# Patient Record
Sex: Female | Born: 1955 | Race: White | Hispanic: No | Marital: Married | Smoking: Never smoker
Health system: Southern US, Community
[De-identification: ages and names within clinical notes are randomized; demographics above are authoritative.]

## PROBLEM LIST (undated history)

## (undated) DIAGNOSIS — Z806 Family history of leukemia: Secondary | ICD-10-CM

## (undated) DIAGNOSIS — K635 Polyp of colon: Secondary | ICD-10-CM

## (undated) DIAGNOSIS — R112 Nausea with vomiting, unspecified: Secondary | ICD-10-CM

## (undated) DIAGNOSIS — K760 Fatty (change of) liver, not elsewhere classified: Secondary | ICD-10-CM

## (undated) DIAGNOSIS — K219 Gastro-esophageal reflux disease without esophagitis: Secondary | ICD-10-CM

## (undated) DIAGNOSIS — Z800 Family history of malignant neoplasm of digestive organs: Secondary | ICD-10-CM

## (undated) DIAGNOSIS — T7840XA Allergy, unspecified, initial encounter: Secondary | ICD-10-CM

## (undated) DIAGNOSIS — C801 Malignant (primary) neoplasm, unspecified: Secondary | ICD-10-CM

## (undated) DIAGNOSIS — Z8489 Family history of other specified conditions: Secondary | ICD-10-CM

## (undated) DIAGNOSIS — R011 Cardiac murmur, unspecified: Secondary | ICD-10-CM

## (undated) DIAGNOSIS — K602 Anal fissure, unspecified: Secondary | ICD-10-CM

## (undated) DIAGNOSIS — Z803 Family history of malignant neoplasm of breast: Secondary | ICD-10-CM

## (undated) DIAGNOSIS — Z8719 Personal history of other diseases of the digestive system: Secondary | ICD-10-CM

## (undated) DIAGNOSIS — R7303 Prediabetes: Secondary | ICD-10-CM

## (undated) DIAGNOSIS — T8859XA Other complications of anesthesia, initial encounter: Secondary | ICD-10-CM

## (undated) DIAGNOSIS — M8580 Other specified disorders of bone density and structure, unspecified site: Secondary | ICD-10-CM

## (undated) DIAGNOSIS — G4730 Sleep apnea, unspecified: Secondary | ICD-10-CM

## (undated) DIAGNOSIS — Z9889 Other specified postprocedural states: Secondary | ICD-10-CM

## (undated) DIAGNOSIS — Z8349 Family history of other endocrine, nutritional and metabolic diseases: Secondary | ICD-10-CM

## (undated) DIAGNOSIS — Z832 Family history of diseases of the blood and blood-forming organs and certain disorders involving the immune mechanism: Secondary | ICD-10-CM

## (undated) DIAGNOSIS — I10 Essential (primary) hypertension: Secondary | ICD-10-CM

## (undated) DIAGNOSIS — M1990 Unspecified osteoarthritis, unspecified site: Secondary | ICD-10-CM

## (undated) DIAGNOSIS — E669 Obesity, unspecified: Secondary | ICD-10-CM

## (undated) DIAGNOSIS — T4145XA Adverse effect of unspecified anesthetic, initial encounter: Secondary | ICD-10-CM

## (undated) HISTORY — DX: Essential (primary) hypertension: I10

## (undated) HISTORY — DX: Unspecified osteoarthritis, unspecified site: M19.90

## (undated) HISTORY — DX: Polyp of colon: K63.5

## (undated) HISTORY — DX: Family history of leukemia: Z80.6

## (undated) HISTORY — DX: Family history of malignant neoplasm of breast: Z80.3

## (undated) HISTORY — PX: TONSILECTOMY, ADENOIDECTOMY, BILATERAL MYRINGOTOMY AND TUBES: SHX2538

## (undated) HISTORY — DX: Gastro-esophageal reflux disease without esophagitis: K21.9

## (undated) HISTORY — DX: Family history of other endocrine, nutritional and metabolic diseases: Z83.49

## (undated) HISTORY — PX: WISDOM TOOTH EXTRACTION: SHX21

## (undated) HISTORY — DX: Other specified disorders of bone density and structure, unspecified site: M85.80

## (undated) HISTORY — DX: Family history of diseases of the blood and blood-forming organs and certain disorders involving the immune mechanism: Z83.2

## (undated) HISTORY — PX: POLYPECTOMY: SHX149

## (undated) HISTORY — DX: Family history of malignant neoplasm of digestive organs: Z80.0

## (undated) HISTORY — DX: Obesity, unspecified: E66.9

## (undated) HISTORY — DX: Sleep apnea, unspecified: G47.30

## (undated) HISTORY — DX: Allergy, unspecified, initial encounter: T78.40XA

## (undated) HISTORY — PX: WRIST SURGERY: SHX841

## (undated) HISTORY — DX: Malignant (primary) neoplasm, unspecified: C80.1

## (undated) HISTORY — DX: Anal fissure, unspecified: K60.2

---
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
## 2013-07-27 HISTORY — PX: COLONOSCOPY: SHX174

## 2015-10-14 DIAGNOSIS — H35369 Drusen (degenerative) of macula, unspecified eye: Secondary | ICD-10-CM | POA: Insufficient documentation

## 2016-03-02 DIAGNOSIS — G4733 Obstructive sleep apnea (adult) (pediatric): Secondary | ICD-10-CM | POA: Insufficient documentation

## 2018-06-06 ENCOUNTER — Other Ambulatory Visit: Payer: Self-pay | Admitting: Internal Medicine

## 2018-06-06 DIAGNOSIS — K760 Fatty (change of) liver, not elsewhere classified: Secondary | ICD-10-CM

## 2018-06-06 DIAGNOSIS — R109 Unspecified abdominal pain: Secondary | ICD-10-CM

## 2018-06-10 ENCOUNTER — Encounter (INDEPENDENT_AMBULATORY_CARE_PROVIDER_SITE_OTHER): Payer: Self-pay

## 2018-06-10 ENCOUNTER — Ambulatory Visit
Admission: RE | Admit: 2018-06-10 | Discharge: 2018-06-10 | Disposition: A | Discharge status: Home / Self Care | Payer: Self-pay | Source: Ambulatory Visit | Attending: Internal Medicine | Admitting: Internal Medicine

## 2018-06-10 ENCOUNTER — Encounter: Payer: Self-pay | Admitting: Internal Medicine

## 2018-06-10 ENCOUNTER — Ambulatory Visit: Payer: BLUE CROSS/BLUE SHIELD | Admitting: Internal Medicine

## 2018-06-10 VITALS — BP 118/84 | HR 68 | Ht 65.0 in | Wt 199.0 lb

## 2018-06-10 DIAGNOSIS — Z86010 Personal history of colonic polyps: Secondary | ICD-10-CM | POA: Diagnosis not present

## 2018-06-10 DIAGNOSIS — R1310 Dysphagia, unspecified: Secondary | ICD-10-CM | POA: Diagnosis not present

## 2018-06-10 DIAGNOSIS — K219 Gastro-esophageal reflux disease without esophagitis: Secondary | ICD-10-CM | POA: Diagnosis not present

## 2018-06-10 DIAGNOSIS — R1012 Left upper quadrant pain: Secondary | ICD-10-CM | POA: Diagnosis not present

## 2018-06-10 DIAGNOSIS — K760 Fatty (change of) liver, not elsewhere classified: Secondary | ICD-10-CM

## 2018-06-10 DIAGNOSIS — R109 Unspecified abdominal pain: Secondary | ICD-10-CM

## 2018-06-10 MED ORDER — PANTOPRAZOLE SODIUM 40 MG PO TBEC: 40.0000 mg | DELAYED_RELEASE_TABLET | Freq: Every day | ORAL | 11 refills | Status: DC

## 2018-06-10 NOTE — Patient Instructions (Signed)
We have sent the following medications to your pharmacy for you to pick up at your convenience:  Protonix  Call back when you are ready to schedule your colonoscopy

## 2018-06-10 NOTE — Progress Notes (Signed)
HISTORY OF PRESENT ILLNESS:  Laura Fisher is a 62 y.o. female who is new to this practice is sent today by her primary care provider Dr. Shearon Stalls to establish GI care with a chief complaint of left and right upper quadrant burning discomfort.  The patient has had prior GI care in [place].  She tells me that she is undergone several colonoscopies and has a history of polyps.  She tells me that she has colonoscopies approximately every 5 years and is due for her next examination around June 2020.  There are no outside records for review but these have been requested.  Patient's current history is that of burning discomfort in the left and right upper quadrant for approximately 4 to 6 weeks.  No additional associated symptoms such as nausea or vomiting.  No weight loss.  No bleeding.  No obvious exacerbating or relieving factors other seems to be a musculoskeletal component.  She does have a history of GERD and was doing well on Protonix.  She discontinued this with concerns over possible long-term side effects that she had heard about through the lay press.  She does have intermittent solid food dysphagia to chicken.  She has not had prior EGD.  She would like to resume PPI if safe.  She saw her PCP regarding her upper abdominal burning discomfort and underwent laboratories which were unremarkable.  She also had an abdominal ultrasound in [place] today.  I have reviewed this report as well.  The examination was normal except for fatty liver.  Normal gallbladder.  REVIEW OF SYSTEMS:  All non-GI ROS negative unless otherwise stated in the HPI except for cough, itching, sleeping problems  Past Medical History:  Diagnosis Date  . Anal fissure   . Colon polyp   . GERD (gastroesophageal reflux disease)   . High blood pressure   . Obesity   . Sleep apnea     Past Surgical History:  Procedure Laterality Date  . COLONOSCOPY  2015  . TONSILECTOMY, ADENOIDECTOMY, BILATERAL MYRINGOTOMY AND TUBES     . WRIST SURGERY Bilateral     Social History Janasia Netherland  reports that she has never smoked. She has never used smokeless tobacco. She reports that she does not drink alcohol or use drugs.  family history includes Diabetes in her sister; Heart disease in her father and mother; Pancreatic cancer in her other; Tongue cancer (age of onset: 65) in her mother.  Allergies  Allergen Reactions  . Ciprofloxacin   . Erythromycin   . Macrobid [Nitrofurantoin Macrocrystal]   . Septra [Sulfamethoxazole-Trimethoprim]        PHYSICAL EXAMINATION: Vital signs: BP 118/84   Pulse 68   Ht 5\' 5"  (1.651 m)   Wt 199 lb (90.3 kg)   BMI 33.12 kg/m   Constitutional: generally well-appearing, no acute distress Psychiatric: alert and oriented x3, cooperative Eyes: extraocular movements intact, anicteric, conjunctiva pink Mouth: oral pharynx moist, no lesions Neck: supple no lymphadenopathy Cardiovascular: heart regular rate and rhythm, no murmur Lungs: clear to auscultation bilaterally Abdomen: soft, nontender, nondistended, no obvious ascites, no peritoneal signs, normal bowel sounds, no organomegaly Rectal: Deferred until colonoscopy Extremities: no clubbing, cyanosis, or lower extremity edema bilaterally Skin: no lesions on visible extremities Neuro: No focal deficits.  Cranial nerves intact  ASSESSMENT:  1.  Vague upper abdominal burning discomfort.  No alarm features.  Likely musculoskeletal 2.  GERD.  Untreated.  Symptomatic 3.  Intermittent solid food dysphasia chicken.  Rule out peptic stricture 4.  History of colon polyps due for surveillance in the very near future. 4.  Obesity.  BMI 33.  Fatty liver on ultrasound   PLAN:  1.  Reflux precautions 2.  Weight loss 3.  Prescribe pantoprazole 40 mg daily.  Multiple refills 4.  Discussed current knowledge base regarding chronic PPI use and potential long-term side effects 5.  Schedule upper endoscopy to evaluate chronic GERD,  dysphasia, and upper abdominal discomfort.The nature of the procedure, as well as the risks, benefits, and alternatives were carefully and thoroughly reviewed with the patient. Ample time for discussion and questions allowed. The patient understood, was satisfied, and agreed to proceed. 6.  Schedule surveillance colonoscopy..The nature of the procedure, as well as the risks, benefits, and alternatives were carefully and thoroughly reviewed with the patient. Ample time for discussion and questions allowed. The patient understood, was satisfied, and agreed to proceed. 7.  Obtain outside records for review  A copy of this consultation note has been sent to Dr. Shearon Stalls

## 2018-08-04 ENCOUNTER — Ambulatory Visit (INDEPENDENT_AMBULATORY_CARE_PROVIDER_SITE_OTHER): Payer: BLUE CROSS/BLUE SHIELD | Admitting: Family Medicine

## 2018-08-04 ENCOUNTER — Ambulatory Visit (INDEPENDENT_AMBULATORY_CARE_PROVIDER_SITE_OTHER): Payer: Self-pay

## 2018-08-04 ENCOUNTER — Encounter (INDEPENDENT_AMBULATORY_CARE_PROVIDER_SITE_OTHER): Payer: Self-pay | Admitting: Family Medicine

## 2018-08-04 DIAGNOSIS — M25561 Pain in right knee: Secondary | ICD-10-CM

## 2018-08-04 MED ORDER — ETODOLAC 400 MG PO TABS: 400.0000 mg | ORAL_TABLET | Freq: Two times a day (BID) | ORAL | 3 refills | Status: DC | PRN

## 2018-08-04 NOTE — Progress Notes (Signed)
Office Visit Note   Patient: Laura Fisher           Date of Birth: September 16, 1955           MRN: [id number] Visit Date: 08/04/2018 Requested by: Kennieth Rad, MD [facility] [address] [place] [address] PCP: Kennieth Rad, MD  Subjective: Chief Complaint  Patient presents with  . Right Knee - Pain    Pain started with going up steps 08/01/18 & felt a pop in the knee with walking the next day. Pain is medial aspect.    HPI: She is a 63 year old with right knee pain.  On January 6 she was walking up steps at church and felt a sudden pop in her knee with severe pain.  She had to stop for a minute, then she was able to walk without any discomfort.  The next day she was fine throughout the morning, but then in the afternoon she had another episode of sudden severe pain, unable since then her knee has been continuously uncomfortable, pain on the medial aspect with some swelling and a severe limp.  She never really had problems with her knee prior to this.  She has had troubles with her weight and would like to get back to a walking regimen to help lose weight, but cannot right now because of his pain.              ROS: She has hypertension which has been well controlled.  Other systems are reviewed and are negative.  Objective: Vital Signs: There were no vitals taken for this visit.  Physical Exam:  Right knee: 1+ effusion, no warmth or erythema.  Exquisite tenderness over the anteromedial and posterior lateral joint lines.  Pain but no palpable click with McMurray's.  She is able to fully extend her knee, flexion is limited to about 110.  Imaging: X-rays right knee: Mild to moderate tricompartmental arthritic change with a possible loose body seen on the AP view.  I do not see it on the lateral view, however.  Assessment & Plan: 1.  Right knee pain with possible loose body versus meniscus tear -Discussed options with patient, she would like to proceed with MRI scan  to look for loose body or meniscus tear and surgical consult depending on the findings.  Lodine for inflammation.   Follow-Up Instructions: No follow-ups on file.      Procedures: No procedures performed  No notes on file    PMFS History: Patient Active Problem List   Diagnosis Date Noted  . Moderate obstructive sleep apnea 03/02/2016  . Drusen 10/14/2015   Past Medical History:  Diagnosis Date  . Anal fissure   . Colon polyp   . GERD (gastroesophageal reflux disease)   . High blood pressure   . Obesity   . Sleep apnea     Family History  Problem Relation Age of Onset  . Tongue cancer Mother 73  . Heart disease Mother   . Heart disease Father   . Diabetes Sister   . Pancreatic cancer Other        Nephew    Past Surgical History:  Procedure Laterality Date  . COLONOSCOPY  2015  . TONSILECTOMY, ADENOIDECTOMY, BILATERAL MYRINGOTOMY AND TUBES    . WRIST SURGERY Bilateral    Social History   Occupational History  . Occupation: Art therapist  Tobacco Use  . Smoking status: Never Smoker  . Smokeless tobacco: Never Used  Substance and Sexual Activity  .  Alcohol use: Never    Frequency: Never  . Drug use: Never  . Sexual activity: Yes    Partners: Male

## 2018-08-06 ENCOUNTER — Ambulatory Visit
Admission: RE | Admit: 2018-08-06 | Discharge: 2018-08-06 | Disposition: A | Discharge status: Home / Self Care | Payer: BLUE CROSS/BLUE SHIELD | Source: Ambulatory Visit | Attending: Family Medicine | Admitting: Family Medicine

## 2018-08-06 DIAGNOSIS — M25561 Pain in right knee: Secondary | ICD-10-CM

## 2018-08-08 ENCOUNTER — Telehealth (INDEPENDENT_AMBULATORY_CARE_PROVIDER_SITE_OTHER): Payer: Self-pay | Admitting: Family Medicine

## 2018-08-08 DIAGNOSIS — M25561 Pain in right knee: Secondary | ICD-10-CM

## 2018-08-08 NOTE — Telephone Encounter (Signed)
I called and advised the patient of all instructions. She will continue to use ice or heat if needed (whichever gives her more relief).  She is choosing to wait a little longer before trying a cortisone injection.  She then asked if PT would be a good idea for her, and if it is, when should she start this? I asked her to check into the PT facilities in [place], [place] and let us know where she would like to go - she'll call back with this info.

## 2018-08-08 NOTE — Telephone Encounter (Signed)
Patient called advised she had her MRI on Saturday and asked if the results have come back yet. The number to contact patient is [phone]

## 2018-08-08 NOTE — Telephone Encounter (Signed)
MRI shows a substantial amount of arthritis in the joint, no sign of loose body or meniscus tear that would benefit from arthroscopic surgery.  If pain is not improved, could consider a cortisone injection.

## 2018-08-08 NOTE — Telephone Encounter (Signed)
See other note

## 2018-08-08 NOTE — Telephone Encounter (Signed)
We do have the results in the chart - please advise.

## 2018-08-08 NOTE — Telephone Encounter (Signed)
Ok to bear weight as tolerated.  For arthritis: - glucosamine sulfate 1,000 mg twice daily - turmeric capsules 500 mg twice daily - avoid sugar - try to lose weight to a normal weight for height - walk for exercise once pain improves

## 2018-08-08 NOTE — Telephone Encounter (Signed)
I called and advised the patient of her results. She has been icing the knee and it is helping with the pain, but she has been afraid to put any weight on the leg. Is it ok to start bearing weight, and if so, how much? Is there anything else she should be doing for the arthritis in the knee?

## 2018-08-09 NOTE — Addendum Note (Signed)
Addended by: Hortencia Pilar on: 08/09/2018 07:53 AM   Modules accepted: Orders

## 2018-08-09 NOTE — Telephone Encounter (Signed)
Generic external PT order placed.

## 2018-08-10 ENCOUNTER — Ambulatory Visit (INDEPENDENT_AMBULATORY_CARE_PROVIDER_SITE_OTHER): Payer: BLUE CROSS/BLUE SHIELD | Admitting: Family Medicine

## 2018-08-10 ENCOUNTER — Encounter (INDEPENDENT_AMBULATORY_CARE_PROVIDER_SITE_OTHER): Payer: Self-pay | Admitting: Family Medicine

## 2018-08-10 DIAGNOSIS — E559 Vitamin D deficiency, unspecified: Secondary | ICD-10-CM

## 2018-08-10 DIAGNOSIS — M25562 Pain in left knee: Secondary | ICD-10-CM | POA: Diagnosis not present

## 2018-08-10 DIAGNOSIS — M25561 Pain in right knee: Secondary | ICD-10-CM

## 2018-08-10 NOTE — Progress Notes (Signed)
   Office Visit Note   Patient: Laura Fisher           Date of Birth: 1955/11/18           MRN: [id number] Visit Date: 08/10/2018 Requested by: Kennieth Rad, MD [facility] [address] [place] [address] PCP: Kennieth Rad, MD  Subjective: Chief Complaint  Patient presents with  . Right Knee - Pain, Follow-up    Can bear weight on knee now.    HPI: She is here for follow-up right greater than left knee pain.  Pain improving, still hurting quite a bit but not as much as before.  MRI showed arthritis, no sign of meniscus tear or loose body.               ROS: Noncontributory  Objective: Vital Signs: There were no vitals taken for this visit.  Physical Exam:  Right knee: Trace effusion, no warmth or erythema.  Very tender on the medial joint line.  No palpable click with McMurray's.  Left knee is also slightly tender on the medial joint line.  Imaging: None today.  Assessment & Plan: 1.  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  Over-the-counter vitamins to support bone health.  Check vitamin D level today.  She is starting to change diet to work on weight loss.   Follow-Up Instructions: No follow-ups on file.      Procedures: No procedures performed  No notes on file    PMFS History: Patient Active Problem List   Diagnosis Date Noted  . Moderate obstructive sleep apnea 03/02/2016  . Drusen 10/14/2015   Past Medical History:  Diagnosis Date  . Anal fissure   . Colon polyp   . GERD (gastroesophageal reflux disease)   . High blood pressure   . Obesity   . Sleep apnea     Family History  Problem Relation Age of Onset  . Tongue cancer Mother 64  . Heart disease Mother   . Heart disease Father   . Diabetes Sister   . Pancreatic cancer Other        Nephew    Past Surgical History:  Procedure Laterality Date  . COLONOSCOPY  2015  . TONSILECTOMY, ADENOIDECTOMY,  BILATERAL MYRINGOTOMY AND TUBES    . WRIST SURGERY Bilateral    Social History   Occupational History  . Occupation: Art therapist  Tobacco Use  . Smoking status: Never Smoker  . Smokeless tobacco: Never Used  Substance and Sexual Activity  . Alcohol use: Never    Frequency: Never  . Drug use: Never  . Sexual activity: Yes    Partners: Male

## 2018-08-10 NOTE — Telephone Encounter (Signed)
The order was given to the patient at today's office visit.

## 2018-08-10 NOTE — Patient Instructions (Signed)
   Vitamin D3:  5,000 IU daily  Magnesium:  200-400 mg daily  Vitamin K2:  100 mcg daily  Calcium from dietary sources.

## 2018-08-11 ENCOUNTER — Telehealth (INDEPENDENT_AMBULATORY_CARE_PROVIDER_SITE_OTHER): Payer: Self-pay | Admitting: Family Medicine

## 2018-08-11 LAB — VITAMIN D 25 HYDROXY (VIT D DEFICIENCY, FRACTURES): Vit D, 25-Hydroxy: 35 ng/mL (ref 30–100)

## 2018-08-11 NOTE — Telephone Encounter (Signed)
Vitamin D is 35.

## 2018-10-05 ENCOUNTER — Ambulatory Visit (INDEPENDENT_AMBULATORY_CARE_PROVIDER_SITE_OTHER): Payer: BLUE CROSS/BLUE SHIELD | Admitting: Family Medicine

## 2018-10-05 ENCOUNTER — Other Ambulatory Visit: Payer: Self-pay

## 2018-10-05 ENCOUNTER — Encounter (INDEPENDENT_AMBULATORY_CARE_PROVIDER_SITE_OTHER): Payer: Self-pay | Admitting: Family Medicine

## 2018-10-05 DIAGNOSIS — M25561 Pain in right knee: Secondary | ICD-10-CM

## 2018-10-05 DIAGNOSIS — M1711 Unilateral primary osteoarthritis, right knee: Secondary | ICD-10-CM

## 2018-10-05 MED ORDER — DICLOFENAC SODIUM 1 % TD GEL: 4.0000 g | Freq: Four times a day (QID) | TRANSDERMAL | 6 refills | Status: DC | PRN

## 2018-10-05 NOTE — Progress Notes (Signed)
   Office Visit Note   Patient: Laura Fisher           Date of Birth: 1956/07/16           MRN: [id number] Visit Date: 10/05/2018 Requested by: Kennieth Rad, MD [facility] [address] [place] [address] PCP: Kennieth Rad, MD  Subjective: Chief Complaint  Patient presents with  . Right Lower Leg - Pain    Pain medial aspect of lower leg. Pain all last week - had to ice it. Was worse 2 days ago and has subsided a little.    HPI: She is here with right lower leg pain.  She finished physical therapy and her knee feels much better but couple days ago she noticed a different pain just below the medial aspect of her knee.  Again is starting to improve, but has not gone away completely and she wanted to be sure nothing else was going on.              ROS: Noncontributory  Objective: Vital Signs: There were no vitals taken for this visit.  Physical Exam:  Right knee: Trace joint effusion, no warmth or erythema.  No pain with patella compression, mild tenderness medial joint line.  Mild tenderness near the Pes bursa area, this seems to reproduce her pain.  Imaging: None today.  MRI images were reviewed on computer, I question whether there might be some swelling of the anserine bursa.  Assessment & Plan: 1.  Improved right knee DJD, with new pain which could be medial hamstring tendinopathy or anserine bursitis -Trial of topical diclofenac.  Home exercises indefinitely.  Follow-up as needed.     Procedures: No procedures performed  No notes on file     PMFS History: Patient Active Problem List   Diagnosis Date Noted  . Moderate obstructive sleep apnea 03/02/2016  . Drusen 10/14/2015   Past Medical History:  Diagnosis Date  . Anal fissure   . Colon polyp   . GERD (gastroesophageal reflux disease)   . High blood pressure   . Obesity   . Sleep apnea     Family History  Problem Relation Age of Onset  . Tongue cancer Mother 46  . Heart disease  Mother   . Heart disease Father   . Diabetes Sister   . Pancreatic cancer Other        Nephew    Past Surgical History:  Procedure Laterality Date  . COLONOSCOPY  2015  . TONSILECTOMY, ADENOIDECTOMY, BILATERAL MYRINGOTOMY AND TUBES    . WRIST SURGERY Bilateral    Social History   Occupational History  . Occupation: Art therapist  Tobacco Use  . Smoking status: Never Smoker  . Smokeless tobacco: Never Used  Substance and Sexual Activity  . Alcohol use: Never    Frequency: Never  . Drug use: Never  . Sexual activity: Yes    Partners: Male

## 2019-04-07 ENCOUNTER — Other Ambulatory Visit: Payer: Self-pay | Admitting: Obstetrics and Gynecology

## 2019-04-07 DIAGNOSIS — N644 Mastodynia: Secondary | ICD-10-CM

## 2019-04-07 DIAGNOSIS — N6320 Unspecified lump in the left breast, unspecified quadrant: Secondary | ICD-10-CM

## 2019-04-07 DIAGNOSIS — R928 Other abnormal and inconclusive findings on diagnostic imaging of breast: Secondary | ICD-10-CM

## 2019-04-07 DIAGNOSIS — N6453 Retraction of nipple: Secondary | ICD-10-CM

## 2019-04-11 ENCOUNTER — Encounter: Payer: Self-pay | Admitting: Internal Medicine

## 2019-04-13 ENCOUNTER — Ambulatory Visit
Admission: RE | Admit: 2019-04-13 | Discharge: 2019-04-13 | Disposition: A | Discharge status: Home / Self Care | Payer: BC Managed Care – PPO | Source: Ambulatory Visit | Attending: Obstetrics and Gynecology | Admitting: Obstetrics and Gynecology

## 2019-04-13 ENCOUNTER — Other Ambulatory Visit: Payer: Self-pay

## 2019-04-13 ENCOUNTER — Other Ambulatory Visit: Payer: Self-pay | Admitting: Diagnostic Radiology

## 2019-04-13 DIAGNOSIS — N644 Mastodynia: Secondary | ICD-10-CM

## 2019-04-13 DIAGNOSIS — N6453 Retraction of nipple: Secondary | ICD-10-CM

## 2019-04-13 DIAGNOSIS — N6320 Unspecified lump in the left breast, unspecified quadrant: Secondary | ICD-10-CM

## 2019-04-13 DIAGNOSIS — R928 Other abnormal and inconclusive findings on diagnostic imaging of breast: Secondary | ICD-10-CM

## 2019-04-14 ENCOUNTER — Other Ambulatory Visit: Payer: BC Managed Care – PPO

## 2019-04-14 ENCOUNTER — Other Ambulatory Visit: Payer: Self-pay | Admitting: Obstetrics and Gynecology

## 2019-04-14 ENCOUNTER — Telehealth: Payer: Self-pay | Admitting: Hematology and Oncology

## 2019-04-14 ENCOUNTER — Telehealth: Payer: Self-pay | Admitting: Internal Medicine

## 2019-04-14 ENCOUNTER — Encounter: Payer: Self-pay | Admitting: *Deleted

## 2019-04-14 DIAGNOSIS — R599 Enlarged lymph nodes, unspecified: Secondary | ICD-10-CM

## 2019-04-14 DIAGNOSIS — N63 Unspecified lump in unspecified breast: Secondary | ICD-10-CM

## 2019-04-14 NOTE — Telephone Encounter (Signed)
Pt called to inform that she was just dc with breast cancer. She is scheduled for a colon with Dr. Henrene Pastor on 10/21 but wants to know if she could have procedure much sooner. Please call her.

## 2019-04-14 NOTE — Telephone Encounter (Signed)
Pt due for colon in October. Just found out she has breast cancer and wants to have colon done sooner. Pts appts moved up and pt aware.

## 2019-04-14 NOTE — Telephone Encounter (Signed)
Spoke with patient to confirm morning [facility] appointment on 9/23, packet will be emailed to patient

## 2019-04-17 ENCOUNTER — Ambulatory Visit
Admission: RE | Admit: 2019-04-17 | Discharge: 2019-04-17 | Disposition: A | Discharge status: Home / Self Care | Payer: BC Managed Care – PPO | Source: Ambulatory Visit | Attending: Obstetrics and Gynecology | Admitting: Obstetrics and Gynecology

## 2019-04-17 ENCOUNTER — Telehealth: Payer: Self-pay | Admitting: Hematology and Oncology

## 2019-04-17 ENCOUNTER — Other Ambulatory Visit: Payer: Self-pay

## 2019-04-17 DIAGNOSIS — R599 Enlarged lymph nodes, unspecified: Secondary | ICD-10-CM

## 2019-04-17 DIAGNOSIS — N63 Unspecified lump in unspecified breast: Secondary | ICD-10-CM

## 2019-04-17 NOTE — Telephone Encounter (Signed)
Error

## 2019-04-18 ENCOUNTER — Other Ambulatory Visit: Payer: Self-pay

## 2019-04-18 ENCOUNTER — Encounter: Payer: Self-pay | Admitting: Internal Medicine

## 2019-04-18 ENCOUNTER — Other Ambulatory Visit: Payer: Self-pay | Admitting: *Deleted

## 2019-04-18 ENCOUNTER — Other Ambulatory Visit: Payer: BC Managed Care – PPO

## 2019-04-18 ENCOUNTER — Ambulatory Visit (AMBULATORY_SURGERY_CENTER): Payer: Self-pay | Admitting: *Deleted

## 2019-04-18 VITALS — Temp 97.4°F | Ht 65.0 in | Wt 193.6 lb

## 2019-04-18 DIAGNOSIS — Z171 Estrogen receptor negative status [ER-]: Secondary | ICD-10-CM | POA: Insufficient documentation

## 2019-04-18 DIAGNOSIS — Z86010 Personal history of colonic polyps: Secondary | ICD-10-CM

## 2019-04-18 DIAGNOSIS — C50212 Malignant neoplasm of upper-inner quadrant of left female breast: Secondary | ICD-10-CM

## 2019-04-18 MED ORDER — NA SULFATE-K SULFATE-MG SULF 17.5-3.13-1.6 GM/177ML PO SOLN: 1.0000 | Freq: Once | ORAL | 0 refills | Status: AC

## 2019-04-18 NOTE — Progress Notes (Signed)
[place] NOTE  Patient Care Team: Kennieth Rad, MD as PCP - General (Internal Medicine) Mauro Kaufmann, RN as Oncology Nurse Navigator Rockwell Germany, RN as Oncology Nurse Navigator Stark Klein, MD as Consulting Physician (General Surgery) Nicholas Lose, MD as Consulting Physician (Hematology and Oncology) Kyung Rudd, MD as Consulting Physician (Radiation Oncology)  CHIEF COMPLAINTS/PURPOSE OF CONSULTATION:  Newly diagnosed breast cancer  HISTORY OF PRESENTING ILLNESS:  Laura Fisher 63 y.o. female is here because of recent diagnosis of invasive ductal carcinoma of the left breast.  She originally had a mammogram in June which showed distortion.  She was made to come back in 3 months for recheck.  This time it was more significant and possibly measured at least 7 to 9 mm.  Biopsy on 04/13/19 showed invasive ductal carcinoma, grade 3, HER-2 equivocal (2+), ER/PR negative, Ki67 40%. US of the left axilla on 04/17/19 showed no lympadenopathy. She presents to the clinic today for initial evaluation and discussion of treatment options.   I reviewed her records extensively and collaborated the history with the patient.  SUMMARY OF ONCOLOGIC HISTORY: Oncology History  Malignant neoplasm of upper-inner quadrant of left breast in female, estrogen receptor negative ([place])  04/13/2019 Initial Diagnosis   Mammogram performed in [place] which detected 7 mm left breast mass at 11:30 position, axilla negative, biopsy revealed grade 3 invasive ductal carcinoma ER PR and HER-2 negative, Ki-67 40%   04/19/2019 Cancer Staging   Staging form: Breast, AJCC 8th Edition - Clinical stage from 04/19/2019: Stage IB (cT1b, cN0, cM0, G3, ER-, PR-, HER2-) - Signed by Nicholas Lose, MD on 04/19/2019     MEDICAL HISTORY:  Past Medical History:  Diagnosis Date  . Allergy    seasonal  . Anal fissure   . Arthritis    right knee  . Cancer [facility])    breast left side  dx 04/14/19  . Colon polyp   . GERD (gastroesophageal reflux disease)   . High blood pressure   . Obesity   . Osteopenia   . Sleep apnea    use a oral device    SURGICAL HISTORY: Past Surgical History:  Procedure Laterality Date  . COLONOSCOPY  2015  . POLYPECTOMY    . TONSILECTOMY, ADENOIDECTOMY, BILATERAL MYRINGOTOMY AND TUBES    . WISDOM TOOTH EXTRACTION    . WRIST SURGERY Bilateral     SOCIAL HISTORY: Social History   Socioeconomic History  . Marital status: Married    Spouse name: Not on file  . Number of children: 2  . Years of education: Not on file  . Highest education level: Not on file  Occupational History  . Occupation: Art therapist  Social Needs  . Financial resource strain: Not on file  . Food insecurity    Worry: Not on file    Inability: Not on file  . Transportation needs    Medical: Not on file    Non-medical: Not on file  Tobacco Use  . Smoking status: Never Smoker  . Smokeless tobacco: Never Used  Substance and Sexual Activity  . Alcohol use: Never    Frequency: Never  . Drug use: Never  . Sexual activity: Yes    Partners: Male  Lifestyle  . Physical activity    Days per week: Not on file    Minutes per session: Not on file  . Stress: Not on file  Relationships  . Social Herbalist on phone:  Not on file    Gets together: Not on file    Attends religious service: Not on file    Active member of club or organization: Not on file    Attends meetings of clubs or organizations: Not on file    Relationship status: Not on file  . Intimate partner violence    Fear of current or ex partner: Not on file    Emotionally abused: Not on file    Physically abused: Not on file    Forced sexual activity: Not on file  Other Topics Concern  . Not on file  Social History Narrative  . Not on file    FAMILY HISTORY: Family History  Problem Relation Age of Onset  . Tongue cancer Mother 55  . Heart disease Mother   . Heart disease  Father   . Diabetes Sister   . Pancreatic cancer Other        Nephew  . Stomach cancer Maternal Aunt   . Cancer Paternal Aunt   . Leukemia Paternal Aunt   . Colon cancer Neg Hx   . Colon polyps Neg Hx   . Esophageal cancer Neg Hx   . Rectal cancer Neg Hx     ALLERGIES:  is allergic to balsam; benzoic acid; ciprofloxacin; erythromycin; macrobid [nitrofurantoin macrocrystal]; neomycin; other; quaternium-15; septra [sulfamethoxazole-trimethoprim]; shellac; and nickel.  MEDICATIONS:  Current Outpatient Medications  Medication Sig Dispense Refill  . acetaminophen (TYLENOL) 325 MG tablet Take 650 mg by mouth every 6 (six) hours as needed.    . benazepril (LOTENSIN) 20 MG tablet Take 20 mg by mouth daily.    . Calcium Carbonate (CALTRATE 600 PO) Take by mouth daily.    . calcium carbonate (TUMS - DOSED IN MG ELEMENTAL CALCIUM) 500 MG chewable tablet Chew 1 tablet by mouth daily.    . Carboxymethylcell-Hypromellose (GENTEAL OP) Apply to eye.    . Cholecalciferol (VITAMIN D3 PO) Take by mouth.    . clobetasol ointment (TEMOVATE) 0.05 % Apply 2 times a day to affected areas.  Stop when smooth. Do not apply to face or skin folds.    Marland Kitchen desonide (DESOWEN) 0.05 % ointment Apply 1 application topically 2 (two) times daily.    . diclofenac sodium (VOLTAREN) 1 % GEL Apply 4 g topically 4 (four) times daily as needed. (Patient not taking: Reported on 04/18/2019) 500 g 6  . fexofenadine (ALLEGRA) 180 MG tablet Take by mouth.    . Glucosamine Sulfate 1000 MG TABS Take by mouth.    Marland Kitchen ibuprofen (ADVIL,MOTRIN) 200 MG tablet Take 200 mg by mouth every 8 (eight) hours as needed.    Marland Kitchen Ketotifen Fumarate (ALLERGY EYE DROPS OP) Apply to eye.    . Magnesium 250 MG TABS Take by mouth.    . Multiple Vitamin (MULTIVITAMIN) capsule Take 1 capsule by mouth daily.    . multivitamin-lutein (OCUVITE-LUTEIN) CAPS capsule Take 1 capsule by mouth daily.    . pantoprazole (PROTONIX) 40 MG tablet Take 1 tablet (40 mg total)  by mouth daily. 30 tablet 11  . Polyethyl Glycol-Propyl Glycol (SYSTANE OP) Apply to eye.    Vladimir Faster Glycol-Propyl Glycol 0.4-0.3 % SOLN Apply to eye.    . Probiotic Product (PROBIOTIC-10 PO) Take by mouth daily.    Marland Kitchen triamcinolone cream (KENALOG) 0.1 %   1  . Turmeric 500 MG CAPS Take by mouth.     No current facility-administered medications for this visit.     REVIEW OF SYSTEMS:  Constitutional: Denies fevers, chills or abnormal night sweats Eyes: Denies blurriness of vision, double vision or watery eyes Ears, nose, mouth, throat, and face: Denies mucositis or sore throat Respiratory: Denies cough, dyspnea or wheezes Cardiovascular: Denies palpitation, chest discomfort or lower extremity swelling Gastrointestinal:  Denies nausea, heartburn or change in bowel habits Skin: Denies abnormal skin rashes Lymphatics: Denies new lymphadenopathy or easy bruising Neurological:Denies numbness, tingling or new weaknesses Behavioral/Psych: Mood is stable, no new changes  Breast:  Denies any palpable lumps or discharge All other systems were reviewed with the patient and are negative.  PHYSICAL EXAMINATION: ECOG PERFORMANCE STATUS: 0 - Asymptomatic  Vitals:   04/19/19 0855  BP: (!) 164/93  Pulse: 79  Resp: 18  Temp: 97.8 F (36.6 C)  SpO2: 99%   Filed Weights   04/19/19 0855  Weight: 191 lb 12.8 oz (87 kg)    GENERAL:alert, no distress and comfortable SKIN: skin color, texture, turgor are normal, no rashes or significant lesions EYES: normal, conjunctiva are pink and non-injected, sclera clear OROPHARYNX:no exudate, no erythema and lips, buccal mucosa, and tongue normal  NECK: supple, thyroid normal size, non-tender, without nodularity LYMPH:  no palpable lymphadenopathy in the cervical, axillary or inguinal LUNGS: clear to auscultation and percussion with normal breathing effort HEART: regular rate & rhythm and no murmurs and no lower extremity edema ABDOMEN:abdomen soft,  non-tender and normal bowel sounds Musculoskeletal:no cyanosis of digits and no clubbing  PSYCH: alert & oriented x 3 with fluent speech NEURO: no focal motor/sensory deficits BREAST: No palpable nodules in breast. No palpable axillary or supraclavicular lymphadenopathy (exam performed in the presence of a chaperone)   RADIOGRAPHIC STUDIES: I have personally reviewed the radiological reports and agreed with the findings in the report.  ASSESSMENT AND PLAN:  Malignant neoplasm of upper-inner quadrant of left breast in female, estrogen receptor negative ([place]) 04/13/2019:Mammogram performed in [place] which detected 7 mm left breast mass at 11:30 position, axilla negative, biopsy revealed grade 3 invasive ductal carcinoma ER PR and HER-2 negative, Ki-67 40% T1BN0 stage Ib clinical stage  Pathology and radiology counseling: Discussed with the patient, the details of pathology including the type of breast cancer,the clinical staging, the significance of ER, PR and HER-2/neu receptors and the implications for treatment. After reviewing the pathology in detail, we proceeded to discuss the different treatment options between surgery, radiation, chemotherapy, antiestrogen therapies.  Recommendation: 1.  Breast MRI 2.  Breast conserving surgery with sentinel lymph node biopsy 3.  Adjuvant chemotherapy with the final tumor size is greater than 5 mm 4.  Adjuvant radiation Genetics evaluation  Chemotherapy Counseling: I discussed the risks and benefits of chemotherapy including the risks of nausea/ vomiting, risk of infection from low WBC count, fatigue due to chemo or anemia, bruising or bleeding due to low platelets, mouth sores, loss/ change in taste and decreased appetite. Liver and kidney function will be monitored through out chemotherapy as abnormalities in liver and kidney function may be a side effect of treatment. Cardiac dysfunction due to Adriamycin and neuropathy from Taxol was  discussed in detail. Risk of permanent bone marrow dysfunction and leukemia due to chemo were also discussed.  She also wants to consider non-Adriamycin-containing option.  We talked about Taxotere and Cytoxan every 3 weeks x6 cycles as another option.  We will have her see cardiology for evaluation for any heart issues.  She has hypertension.   Return to clinic after surgery to discuss adjuvant treatment plan.   All questions  were answered. The patient knows to call the clinic with any problems, questions or concerns.   Rulon Eisenmenger, MD 04/19/2019    I, Molly Dorshimer, am acting as scribe for Nicholas Lose, MD.  I have reviewed the above documentation for accuracy and completeness, and I agree with the above.

## 2019-04-18 NOTE — Progress Notes (Signed)
No egg or soy allergy known to patient  No issues with past sedation with any surgeries  or procedures, no intubation problems  No diet pills per patient No home 02 use per patient  No blood thinners per patient  Pt denies issues with constipation  No A fib or A flutter  EMMI video sent to pt's e mail   Due to the COVID-19 pandemic we are asking patients to follow these guidelines. Please only bring one care partner. Please be aware that your care partner may wait in the car in the parking lot or if they feel like they will be too hot to wait in the car, they may wait in the lobby on the 4th floor. All care partners are required to wear a mask the entire time (we do not have any that we can provide them), they need to practice social distancing, and we will do a Covid check for all patient's and care partners when you arrive. Also we will check their temperature and your temperature. If the care partner waits in their car they need to stay in the parking lot the entire time and we will call them on their cell phone when the patient is ready for discharge so they can bring the car to the front of the building. Also all patient's will need to wear a mask into building.  suprep coupon provided

## 2019-04-19 ENCOUNTER — Other Ambulatory Visit: Payer: Self-pay | Admitting: *Deleted

## 2019-04-19 ENCOUNTER — Encounter: Payer: Self-pay | Admitting: Licensed Clinical Social Worker

## 2019-04-19 ENCOUNTER — Other Ambulatory Visit: Payer: Self-pay

## 2019-04-19 ENCOUNTER — Ambulatory Visit: Payer: BC Managed Care – PPO | Attending: General Surgery | Admitting: Physical Therapy

## 2019-04-19 ENCOUNTER — Other Ambulatory Visit: Payer: Self-pay | Admitting: General Surgery

## 2019-04-19 ENCOUNTER — Other Ambulatory Visit: Payer: BC Managed Care – PPO

## 2019-04-19 ENCOUNTER — Inpatient Hospital Stay: Payer: BC Managed Care – PPO | Attending: Hematology and Oncology | Admitting: Hematology and Oncology

## 2019-04-19 ENCOUNTER — Ambulatory Visit
Admission: RE | Admit: 2019-04-19 | Discharge: 2019-04-19 | Disposition: A | Discharge status: Home / Self Care | Payer: BC Managed Care – PPO | Source: Ambulatory Visit | Attending: Radiation Oncology | Admitting: Radiation Oncology

## 2019-04-19 ENCOUNTER — Inpatient Hospital Stay: Payer: BC Managed Care – PPO

## 2019-04-19 ENCOUNTER — Ambulatory Visit (HOSPITAL_BASED_OUTPATIENT_CLINIC_OR_DEPARTMENT_OTHER): Payer: BC Managed Care – PPO | Admitting: Licensed Clinical Social Worker

## 2019-04-19 ENCOUNTER — Encounter: Payer: Self-pay | Admitting: Hematology and Oncology

## 2019-04-19 ENCOUNTER — Encounter: Payer: Self-pay | Admitting: Physical Therapy

## 2019-04-19 DIAGNOSIS — Z800 Family history of malignant neoplasm of digestive organs: Secondary | ICD-10-CM | POA: Diagnosis not present

## 2019-04-19 DIAGNOSIS — Z806 Family history of leukemia: Secondary | ICD-10-CM | POA: Diagnosis not present

## 2019-04-19 DIAGNOSIS — C50212 Malignant neoplasm of upper-inner quadrant of left female breast: Secondary | ICD-10-CM | POA: Diagnosis not present

## 2019-04-19 DIAGNOSIS — Z171 Estrogen receptor negative status [ER-]: Secondary | ICD-10-CM

## 2019-04-19 DIAGNOSIS — Z803 Family history of malignant neoplasm of breast: Secondary | ICD-10-CM | POA: Diagnosis not present

## 2019-04-19 DIAGNOSIS — I10 Essential (primary) hypertension: Secondary | ICD-10-CM | POA: Diagnosis not present

## 2019-04-19 DIAGNOSIS — R293 Abnormal posture: Secondary | ICD-10-CM

## 2019-04-19 LAB — CMP (CANCER CENTER ONLY)
ALT: 29 U/L (ref 0–44)
AST: 23 U/L (ref 15–41)
Albumin: 4.8 g/dL (ref 3.5–5.0)
Alkaline Phosphatase: 65 U/L (ref 38–126)
Anion gap: 11 (ref 5–15)
BUN: 13 mg/dL (ref 8–23)
CO2: 25 mmol/L (ref 22–32)
Calcium: 9.7 mg/dL (ref 8.9–10.3)
Chloride: 107 mmol/L (ref 98–111)
Creatinine: 0.81 mg/dL (ref 0.44–1.00)
GFR, Est AFR Am: >60 mL/min (ref >60)
GFR, Estimated: >60 mL/min (ref >60)
Glucose, Bld: 103 mg/dL — ABNORMAL HIGH (ref 70–99)
Potassium: 3.9 mmol/L (ref 3.5–5.1)
Sodium: 143 mmol/L (ref 135–145)
Total Bilirubin: 0.6 mg/dL (ref 0.3–1.2)
Total Protein: 7.8 g/dL (ref 6.5–8.1)

## 2019-04-19 LAB — CBC WITH DIFFERENTIAL (CANCER CENTER ONLY)
Abs Immature Granulocytes: 0.02 10*3/uL (ref 0.00–0.07)
Basophils Absolute: 0 10*3/uL (ref 0.0–0.1)
Basophils Relative: 1 %
Eosinophils Absolute: 0.1 10*3/uL (ref 0.0–0.5)
Eosinophils Relative: 1 %
HCT: 44.2 % (ref 36.0–46.0)
Hemoglobin: 14.8 g/dL (ref 12.0–15.0)
Immature Granulocytes: 0 %
Lymphocytes Relative: 32 %
Lymphs Abs: 2.7 10*3/uL (ref 0.7–4.0)
MCH: 31.6 pg (ref 26.0–34.0)
MCHC: 33.5 g/dL (ref 30.0–36.0)
MCV: 94.2 fL (ref 80.0–100.0)
Monocytes Absolute: 0.5 10*3/uL (ref 0.1–1.0)
Monocytes Relative: 6 %
Neutro Abs: 5.1 10*3/uL (ref 1.7–7.7)
Neutrophils Relative %: 60 %
Platelet Count: 216 10*3/uL (ref 150–400)
RBC: 4.69 MIL/uL (ref 3.87–5.11)
RDW: 13.2 % (ref 11.5–15.5)
WBC Count: 8.4 10*3/uL (ref 4.0–10.5)
nRBC: 0 % (ref 0.0–0.2)

## 2019-04-19 NOTE — Patient Instructions (Signed)

## 2019-04-19 NOTE — Progress Notes (Addendum)
Radiation Oncology         [phone]) [phone] ________________________________  Name: Laura Fisher        MRN: [id number]  Date of Service: 04/19/2019 DOB: Sep 01, 1955  EX:BMWUX, Vito Berger, MD  Stark Klein, MD     REFERRING PHYSICIAN: Stark Klein, MD   DIAGNOSIS: The encounter diagnosis was Malignant neoplasm of upper-inner quadrant of left breast in female, estrogen receptor negative ([place]).   HISTORY OF PRESENT ILLNESS: Laura Fisher is a 63 y.o. female seen in the multidisciplinary breast clinic for a new diagnosis of left breast cancer. The patient was noted to have a screening detected mass in the left breast at an outside imaging facility in [place]. She underwent further diagnostic imaging that revealed a 7 x 6 x 5 mm lesion in the left breast without adenopathy. Her biopsy on 04/13/2019 revealed a grade 3 invasive ductal carcinoma, and her cancer was triple negative with a Ki 67 of 40%   PREVIOUS RADIATION THERAPY: No   PAST MEDICAL HISTORY:  Past Medical History:  Diagnosis Date   Allergy    seasonal   Anal fissure    Arthritis    right knee   Cancer ([place])    breast left side dx 04/14/19   Colon polyp    GERD (gastroesophageal reflux disease)    High blood pressure    Obesity    Osteopenia    Sleep apnea    use a oral device       PAST SURGICAL HISTORY: Past Surgical History:  Procedure Laterality Date   COLONOSCOPY  2015   POLYPECTOMY     TONSILECTOMY, ADENOIDECTOMY, BILATERAL MYRINGOTOMY AND TUBES     WISDOM TOOTH EXTRACTION     WRIST SURGERY Bilateral      FAMILY HISTORY:  Family History  Problem Relation Age of Onset   Tongue cancer Mother 67   Heart disease Mother    Heart disease Father    Diabetes Sister    Pancreatic cancer Other        Nephew   Colon cancer Neg Hx    Colon polyps Neg Hx    Esophageal cancer Neg Hx    Rectal cancer Neg Hx    Stomach cancer Neg Hx      SOCIAL HISTORY:  reports  that she has never smoked. She has never used smokeless tobacco. She reports that she does not drink alcohol or use drugs. The patient is married and lives in [place], [place]. She is a Financial trader and enjoys cooking.    ALLERGIES: Balsam, Benzoic acid, Ciprofloxacin, Erythromycin, Macrobid [nitrofurantoin macrocrystal], Neomycin, Other, Quaternium-15, Septra [sulfamethoxazole-trimethoprim], Shellac, and Nickel   MEDICATIONS:  Current Outpatient Medications  Medication Sig Dispense Refill   acetaminophen (TYLENOL) 325 MG tablet Take 650 mg by mouth every 6 (six) hours as needed.     benazepril (LOTENSIN) 20 MG tablet Take 20 mg by mouth daily.     Calcium Carbonate (CALTRATE 600 PO) Take by mouth daily.     calcium carbonate (TUMS - DOSED IN MG ELEMENTAL CALCIUM) 500 MG chewable tablet Chew 1 tablet by mouth daily.     Carboxymethylcell-Hypromellose (GENTEAL OP) Apply to eye.     Cholecalciferol (VITAMIN D3 PO) Take by mouth.     clobetasol ointment (TEMOVATE) 0.05 % Apply 2 times a day to affected areas.  Stop when smooth. Do not apply to face or skin folds.     desonide (DESOWEN) 0.05 % ointment Apply 1 application topically 2 (  two) times daily.     diclofenac sodium (VOLTAREN) 1 % GEL Apply 4 g topically 4 (four) times daily as needed. (Patient not taking: Reported on 04/18/2019) 500 g 6   fexofenadine (ALLEGRA) 180 MG tablet Take by mouth.     Glucosamine Sulfate 1000 MG TABS Take by mouth.     ibuprofen (ADVIL,MOTRIN) 200 MG tablet Take 200 mg by mouth every 8 (eight) hours as needed.     Ketotifen Fumarate (ALLERGY EYE DROPS OP) Apply to eye.     Magnesium 250 MG TABS Take by mouth.     Multiple Vitamin (MULTIVITAMIN) capsule Take 1 capsule by mouth daily.     multivitamin-lutein (OCUVITE-LUTEIN) CAPS capsule Take 1 capsule by mouth daily.     pantoprazole (PROTONIX) 40 MG tablet Take 1 tablet (40 mg total) by mouth daily. 30 tablet 11   Polyethyl Glycol-Propyl Glycol  (SYSTANE OP) Apply to eye.     Polyethyl Glycol-Propyl Glycol 0.4-0.3 % SOLN Apply to eye.     Probiotic Product (PROBIOTIC-10 PO) Take by mouth daily.     triamcinolone cream (KENALOG) 0.1 %   1   Turmeric 500 MG CAPS Take by mouth.     No current facility-administered medications for this encounter.      REVIEW OF SYSTEMS: On review of systems, the patient reports that she is doing well overall. She denies any chest pain, shortness of breath, cough, fevers, chills, night sweats, unintended weight changes. She denies any bowel or bladder disturbances, and denies abdominal pain, nausea or vomiting. She denies any new musculoskeletal or joint aches or pains. A complete review of systems is obtained and is otherwise negative.     PHYSICAL EXAM:  Wt Readings from Last 3 Encounters:  04/18/19 193 lb 9.6 oz (87.8 kg)  06/10/18 199 lb (90.3 kg)   Temp Readings from Last 3 Encounters:  04/18/19 (!) 97.4 F (36.3 C) (Temporal)   BP Readings from Last 3 Encounters:  06/10/18 118/84   Pulse Readings from Last 3 Encounters:  06/10/18 68     In general this is a well appearing caucasian female in no acute distress. She's alert and oriented x4 and appropriate throughout the examination. Cardiopulmonary assessment is negative for acute distress and she exhibits normal effort. Breast exam is deferred.   ECOG = 0  0 - Asymptomatic (Fully active, able to carry on all predisease activities without restriction)  1 - Symptomatic but completely ambulatory (Restricted in physically strenuous activity but ambulatory and able to carry out work of a light or sedentary nature. For example, light housework, office work)  2 - Symptomatic, <50% in bed during the day (Ambulatory and capable of all self care but unable to carry out any work activities. Up and about more than 50% of waking hours)  3 - Symptomatic, >50% in bed, but not bedbound (Capable of only limited self-care, confined to bed or  chair 50% or more of waking hours)  4 - Bedbound (Completely disabled. Cannot carry on any self-care. Totally confined to bed or chair)  5 - Death   Eustace Pen MM, Creech RH, Tormey DC, et al. [phone]). "Toxicity and response criteria of the [facility]". [place] Oncol. 5 (6): 649-55    LABORATORY DATA:  No results found for: WBC, HGB, HCT, MCV, PLT No results found for: NA, K, CL, CO2 No results found for: ALT, AST, GGT, ALKPHOS, BILITOT    RADIOGRAPHY: [place] Axilla Left  Result Date: 04/17/2019  CLINICAL DATA:  63 year old female with recent diagnosis of left breast cancer presenting for left axillary ultrasound. EXAM: ULTRASOUND OF THE LEFT AXILLA COMPARISON:  No prior left axillary ultrasound available for comparison. Correlation made with prior mammograms. FINDINGS: Ultrasound of the left axilla demonstrates multiple normal-appearing lymph nodes. Two lymph nodes in the upper-outer quadrant of the left breast are also seen, who is cortices are well within normal limits for thickness. These are seen on prior mammograms, and have been stable dating back to 2012. IMPRESSION: No evidence of left axillary lymphadenopathy. RECOMMENDATION: Treatment plan for known left breast cancer. I have discussed the findings and recommendations with the patient. If applicable, a reminder letter will be sent to the patient regarding the next appointment. BI-RADS CATEGORY  1: Negative. Electronically Signed   By: Ammie Ferrier M.D.   On: 04/17/2019 16:32   Mm Clip Placement Left  Result Date: 04/13/2019 CLINICAL DATA:  Here for ultrasound-guided biopsy of an irregular mass in the left breast. EXAM: DIAGNOSTIC LEFT MAMMOGRAM POST ULTRASOUND BIOPSY COMPARISON:  Previous exam(s). FINDINGS: Mammographic images were obtained following ultrasound guided biopsy of an irregular mass in the left breast. A ribbon shaped biopsy marking clip is appropriately positioned at the site of biopsy. IMPRESSION:  Ribbon shaped biopsy marking clip appropriately positioned at the site of biopsy in the left breast. Final Assessment: Post Procedure Mammograms for Marker Placement Electronically Signed   By: Zerita Boers M.D.   On: 04/13/2019 14:53   [place] Lt Breast Bx W Loc Dev 1st Lesion Img Bx Spec US Guide  Addendum Date: 04/17/2019   ADDENDUM REPORT: 04/17/2019 07:00 ADDENDUM: Pathology revealed GRADE III INVASIVE DUCTAL CARCINOMA of the Left breast, 11:30 o'clock, 8 cmfn. This was found to be concordant by Dr. Zerita Boers. Pathology results were discussed with the patient by telephone on April 14, 2019. The patient reported doing well after the biopsy with tenderness at the site. Post biopsy instructions and care were reviewed and questions were answered. The patient was encouraged to call [facility] for any additional concerns. The patient was referred to [facility] on April 19, 2019. The patient is scheduled for a Left axillary ultrasound [facility] on April 17, 2019 due to the invasive histology. Pathology results reported by Terie Purser, RN on 04/17/2019. Electronically Signed   By: Zerita Boers M.D.   On: 04/17/2019 07:00   Result Date: 04/17/2019 CLINICAL DATA:  Here for ultrasound-guided biopsy of an irregular hypoechoic mass in the left breast at 11:30 8 cm from the nipple. EXAM: ULTRASOUND GUIDED LEFT BREAST CORE NEEDLE BIOPSY COMPARISON:  Previous exam(s). FINDINGS: I met with the patient and we discussed the procedure of ultrasound-guided biopsy, including benefits and alternatives. We discussed the high likelihood of a successful procedure. We discussed the risks of the procedure, including infection, bleeding, tissue injury, clip migration, and inadequate sampling. Informed written consent was given. The usual time-out protocol was performed immediately prior to the procedure.  Lesion quadrant: Upper inner quadrant Using sterile technique and 1% Lidocaine as local anesthetic, under direct ultrasound visualization, a 12 gauge spring-loaded device was used to perform biopsy of an irregular mass using a lateral approach. At the conclusion of the procedure a ribbon shaped tissue marker clip was deployed into the biopsy cavity. Follow up 2 view mammogram was performed and dictated separately. IMPRESSION: Ultrasound guided biopsy of an irregular mass in the left  breast. No apparent complications. Electronically Signed: By: Zerita Boers M.D. On: 04/13/2019 14:55       IMPRESSION/PLAN: 1. Stage IB, cT1bN0M0 grade 3 triple negative invasive ductal carcinoma of the left breast. Dr. Lisbeth Renshaw discusses the pathology findings and reviews the nature of triple negative left breast disease. The consensus from the breast conference includes breast conservation with lumpectomy with sentinel node biopsy and PAC placement. Dr. Lindi Adie anticipates chemotherapy, and she would benefit from adjuvant radiotherapy. We discussed the risks, benefits, short, and long term effects of radiotherapy, and the patient is interested in proceeding. Dr. Lisbeth Renshaw discusses the delivery and logistics of radiotherapy and anticipates a course of 4-6 1/2  weeks of radiotherapy with deep inspiration breath hold technique, based on today's assessment Dr. Lisbeth Renshaw anticipates 4 weeks. We will see her back about 2 weeks after chemotherapy to discuss the simulation process and anticipate we starting radiotherapy about a month following her chemotherapy.    In a visit lasting 45 minutes, greater than 50% of the time was spent face to face discussing her case, and coordinating the patient's care.  The above documentation reflects my direct findings during this shared patient visit. Please see the separate note by Dr. Lisbeth Renshaw on this date for the remainder of the patient's plan of care.    Carola Rhine, PAC

## 2019-04-19 NOTE — Progress Notes (Signed)
REFERRING PROVIDER: Nicholas Lose, MD [place],  [place] [address]  PRIMARY PROVIDER:  Kennieth Rad, MD  PRIMARY REASON FOR VISIT:  1. Malignant neoplasm of upper-inner quadrant of left breast in female, estrogen receptor negative ([place])   2. Family history of breast cancer   3. Family history of pancreatic cancer   4. Family history of stomach cancer   5. Family history of leukemia    I connected with Laura Fisher on 04/19/2019 at 12:25 PM EDT by Webex and verified that I am speaking with the correct person using two identifiers.    Patient location: [facility] Provider location: office  HISTORY OF PRESENT ILLNESS:   Laura Fisher, a 63 y.o. female, was seen for a [place] cancer genetics consultation at the request of Dr. Lindi Adie due to a personal and family history of cancer.  Laura Fisher presents to clinic today to discuss the possibility of a hereditary predisposition to cancer, genetic testing, and to further clarify her future cancer risks, as well as potential cancer risks for family members.   In 2020, at the age of 13, Laura Fisher was diagnosed with IDC of the left breast, triple negative. The treatment plan includes surgery, adjuvant chemotherapy and adjuvant radiation. Laura Fisher is anxious to have her surgery and would like it as soon as possible, but she also reports she would potentially use genetic test results to help guide her surgical decision.    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  Ovaries intact: yes.  Hysterectomy: no.  Colonoscopy: yes; few polyps. Mammogram within the last year: yes.  Past Medical History:  Diagnosis Date  . Allergy    seasonal  . Anal fissure   . Arthritis    right knee  . Cancer [facility])    breast left side dx 04/14/19  . Colon polyp   . Family history of breast cancer   . Family history of leukemia   . Family history of pancreatic cancer   . Family history of stomach cancer   . GERD (gastroesophageal reflux disease)   . High blood pressure   . Obesity   . Osteopenia   . Sleep apnea    use a oral device    Past Surgical History:  Procedure Laterality Date  . COLONOSCOPY  2015  . POLYPECTOMY    . TONSILECTOMY, ADENOIDECTOMY, BILATERAL MYRINGOTOMY AND TUBES    . WISDOM TOOTH EXTRACTION    . WRIST SURGERY Bilateral     Social History   Socioeconomic History  . Marital status: Married    Spouse name: Not on file  . Number of children: 2  . Years of education: Not on file  . Highest education level: Not on file  Occupational History  . Occupation: Art therapist  Social Needs  . Financial resource strain: Not on file  . Food insecurity    Worry: Not on file  Inability: Not on file  . Transportation needs    Medical: Not on file    Non-medical: Not on file  Tobacco Use  . Smoking status: Never Smoker  . Smokeless tobacco: Never Used  Substance and Sexual Activity  . Alcohol use: Never    Frequency: Never  . Drug use: Never  . Sexual activity: Yes    Partners: Male  Lifestyle  . Physical activity    Days per week: Not on file    Minutes per session: Not on file  . Stress: Not on file  Relationships  . Social Herbalist on phone: Not on file    Gets together: Not on file    Attends religious service: Not on file    Active member of club or organization: Not on file    Attends meetings of clubs or  organizations: Not on file    Relationship status: Not on file  Other Topics Concern  . Not on file  Social History Narrative  . Not on file     FAMILY HISTORY:  We obtained a detailed, 4-generation family history.  Significant diagnoses are listed below: Family History  Problem Relation Age of Onset  . Tongue cancer Mother 80  . Heart disease Mother   . Heart disease Father   . Diabetes Sister   . Pancreatic cancer Other        Nephew  . Stomach cancer Maternal Aunt   . Leukemia Paternal Aunt   . Breast cancer Cousin 81  . Cancer Cousin        unk type  . Colon cancer Neg Hx   . Colon polyps Neg Hx   . Esophageal cancer Neg Hx   . Rectal cancer Neg Hx    Laura Fisher has 2 sons, no history of cancer. She has 1 brother, 4, no history of cancer. She has 1 sister who died in her 54s due to diabetes complications. This sister had a son who had pancreatic cancer and died at 76.   Laura Fisher mother was diagnosed with tongue cancer at 56 and died at 23. She also had a basal cell carcinoma on her cheek in her 72s. Patient had 4 maternal aunts, 3 maternal uncles. One of her aunts had stomach cancer in her 31s. No known cancers in maternal cousins. Her maternal grandparents are deceased. Her grandmothers sister had leukemia.  Laura Fisher's father died at 59. Patient had 3 paternal aunts, 3 paternal uncles. One of her aunts had leukemia and died at 43. Another aunt's daughter had breast cancer at 58. Another cousin possibly has cancer but she is unsure the type. Paternal grandmother died in her 92s due to heart issues, paternal grandfather died in his 10s due to heart issues.  Laura Fisher is unaware of previous family history of genetic testing for hereditary cancer risks. Patient's ancestors are of [place], [place] and [place]. There is no reported Ashkenazi Jewish ancestry. There is no known consanguinity.  GENETIC COUNSELING ASSESSMENT: Laura Fisher is a 63 y.o. female with a personal and  family history which is somewhat suggestive of a hereditary cancer syndrome and predisposition to cancer. We, therefore, discussed and recommended the following at today's visit.   DISCUSSION: We discussed that 5 - 10% of breast cancer is hereditary, with most cases associated with BRCA1/BRCA2 mutations.  There are other genes that can be associated with hereditary cancer syndromes. We discussed that testing is beneficial for several reasons including surgical decision-making  for breast cancer, knowing how to follow individuals after completing their treatment, and understand if other family members could be at risk for cancer and allow them to undergo genetic testing.   We reviewed the characteristics, features and inheritance patterns of hereditary cancer syndromes. We also discussed genetic testing, including the appropriate family members to test, the process of testing, insurance coverage and turn-around-time for results. We discussed the implications of a negative, positive and/or variant of uncertain significant result. In order to get genetic test results in a timely manner so that Laura Fisher can use these genetic test results for surgical decisions, we recommended Laura Fisher pursue genetic testing for the Breast Cancer STAT Panel. Once complete, we recommend Laura Fisher pursue reflex genetic testing to the Common Hereditary Cancers gene panel.   The STAT Breast cancer panel offered by [facility] includes sequencing and rearrangement analysis for the following 9 genes:  ATM, BRCA1, BRCA2, CDH1, CHEK2, PALB2, PTEN, STK11 and TP53.    The Common Hereditary Cancers Panel offered by [facility] includes sequencing and/or deletion duplication testing of the following 47 genes: APC, ATM, AXIN2, BARD1, BMPR1A, BRCA1, BRCA2, BRIP1, CDH1, CDKN2A (p14ARF), CDKN2A (p16INK4a), CKD4, CHEK2, CTNNA1, DICER1, EPCAM (Deletion/duplication testing only), GREM1 (promoter region deletion/duplication testing only), KIT, MEN1,  MLH1, MSH2, MSH3, MSH6, MUTYH, NBN, NF1, NHTL1, PALB2, PDGFRA, PMS2, POLD1, POLE, PTEN, RAD50, RAD51C, RAD51D, RNF43, SDHB, SDHC, SDHD, SMAD4, SMARCA4. STK11, TP53, TSC1, TSC2, and VHL.  The following genes were evaluated for sequence changes only: SDHA and HOXB13 c.251G>A variant only.  Based on Laura Fisher's personal and family history of cancer, she meets medical criteria for genetic testing. Despite that she meets criteria, she may still have an out of pocket cost.   PLAN: After considering the risks, benefits, and limitations, Laura Fisher provided informed consent to pursue genetic testing and the blood sample was sent to [facility] for analysis of the Breast Cancer STAT Panel + Common Hereditary Cancers Panel. Results should be available within approximately 5-12 days' time, at which point they will be disclosed by telephone to Laura Fisher, as will any additional recommendations warranted by these results. Laura Fisher will receive a summary of her genetic counseling visit and a copy of her results once available. This information will also be available in Epic.   Based on Ms. Blackwelder's family history, we recommended those related to her nephew with pancreatic cancer have genetic counseling and testing. Ms. Onley will let us know if we can be of any assistance in coordinating genetic counseling and/or testing for this family member.   Ms. Diluzio questions were answered to her satisfaction today. Our contact information was provided should additional questions or concerns arise. Thank you for the referral and allowing [place] to share in the care of your patient.   Faith Rogue, MS, [facility] Genetic Counselor [email or web address] Phone: [phone]  The patient was seen for a total of 25 minutes in virtual genetic counseling. [employer] Intern Amy Danelle Earthly was present virtually and assisted with this case.  Drs. Magrinat, Lindi Adie and/or Burr Medico were available for discussion regarding this case.    _______________________________________________________________________ For Office Staff:  Number of people involved in session: 2 Was an Intern/ student involved with case: yes

## 2019-04-19 NOTE — Assessment & Plan Note (Addendum)
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 After reviewing the pathology in detail, we proceeded to discuss the different treatment options between surgery, radiation, chemotherapy, antiestrogen therapies.  Recommendation: 1.  Breast MRI 2.  Breast conserving surgery with sentinel lymph node biopsy 3.  Adjuvant chemotherapy with the final tumor size is greater than 5 mm 4.  Adjuvant radiation Genetics evaluation  Return to clinic after surgery to discuss adjuvant treatment plan.

## 2019-04-19 NOTE — Therapy (Signed)
[place], [place], [address] Phone: [phone]   Fax:  [phone]  Physical Therapy Evaluation  Patient Details  Name: Laura Fisher MRN: [id number] Date of Birth: 03-Mar-1956 Referring Provider (PT): Dr. Stark Klein   Encounter Date: 04/19/2019  PT End of Session - 04/19/19 1148    Visit Number  1    Number of Visits  2    PT Start Time  4827    PT Stop Time  0786   Also saw pt from 1105-1140 for a total of 46 minutes   PT Time Calculation (min)  11 min    Activity Tolerance  Patient tolerated treatment well    Behavior During Therapy  [facility] for tasks assessed/performed       Past Medical History:  Diagnosis Date  . Allergy    seasonal  . Anal fissure   . Arthritis    right knee  . Cancer [facility])    breast left side dx 04/14/19  . Colon polyp   . GERD (gastroesophageal reflux disease)   . High blood pressure   . Obesity   . Osteopenia   . Sleep apnea    use a oral device    Past Surgical History:  Procedure Laterality Date  . COLONOSCOPY  2015  . POLYPECTOMY    . TONSILECTOMY, ADENOIDECTOMY, BILATERAL MYRINGOTOMY AND TUBES    . WISDOM TOOTH EXTRACTION    . WRIST SURGERY Bilateral     There were no vitals filed for this visit.   Subjective Assessment - 04/19/19 1045    Subjective  Patient reports she is here today to be seen by her medical team for her newly diagnosed left breast cancer.    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 It measures 7 mm and is located in the upper inner quadrant with a Ki67 of 40%.    Patient Stated Goals  Reduce lymphedema risk and learn post op shoulder ROM HEP    Currently in Pain?  Yes    Pain Score  --   Varies; none with sitting   Pain Location  Knee    Pain Orientation  Right    Pain Descriptors / Indicators  Aching;Sharp    Pain Type  Chronic  pain    Pain Onset  More than a month ago   Began 08/02/2018   Pain Frequency  Intermittent    Aggravating Factors   Walking    Pain Relieving Factors  Rest; non-weightbearing    Multiple Pain Sites  No         [facility] PT Assessment - 04/19/19 0001      Assessment   Medical Diagnosis  Left breast cancer    Referring Provider (PT)  Dr. Stark Klein    Onset Date/Surgical Date  01/09/19    Hand Dominance  Right    Prior Therapy  none      Precautions   Precautions  Other (comment)    Precaution Comments  active cancer      Restrictions   Weight Bearing Restrictions  No      Balance Screen   Has the patient fallen in the past 6 months  No    Has the patient had a decrease in activity level because of a fear of falling?   No    Is the patient reluctant to leave their home because of a  fear of falling?   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 exercise      Cognition   Overall Cognitive Status  Within Functional Limits for tasks assessed      Posture/Postural Control   Posture/Postural Control  Postural limitations    Postural Limitations  Rounded Shoulders;Forward head      ROM / Strength   AROM / PROM / Strength  AROM;Strength      AROM   AROM Assessment Site  Shoulder;Cervical    Right/Left Shoulder  Right;Left    Right Shoulder Extension  44 Degrees    Right Shoulder Flexion  152 Degrees    Right Shoulder ABduction  151 Degrees    Right Shoulder Internal Rotation  68 Degrees    Right Shoulder External Rotation  80 Degrees    Left Shoulder Extension  40 Degrees    Left Shoulder Flexion  157 Degrees    Left Shoulder ABduction  151 Degrees    Left Shoulder Internal Rotation  60 Degrees     Left Shoulder External Rotation  90 Degrees    Cervical Flexion  WNL    Cervical Extension  50% limited    Cervical - Right Side Bend  50% limited    Cervical - Left Side Bend  25% limited    Cervical - Right Rotation  50% limited    Cervical - Left Rotation  25% limited      Strength   Overall Strength  Within functional limits for tasks performed        LYMPHEDEMA/ONCOLOGY QUESTIONNAIRE - 04/19/19 1054      Type   Cancer Type  Left breast cancer      Lymphedema Assessments   Lymphedema Assessments  Upper extremities      Right Upper Extremity Lymphedema   10 cm Proximal to Olecranon Process  29.9 cm    Olecranon Process  25.4 cm    10 cm Proximal to Ulnar Styloid Process  22.1 cm    Just Proximal to Ulnar Styloid Process  15.6 cm    Across Hand at [employer]  18.8 cm    At [place] of 2nd Digit  6.6 cm      Left Upper Extremity Lymphedema   10 cm Proximal to Olecranon Process  29.7 cm    Olecranon Process  24.6 cm    10 cm Proximal to Ulnar Styloid Process  21.1 cm    Just Proximal to Ulnar Styloid Process  14.4 cm    Across Hand at [employer]  18.8 cm    At [place] of 2nd Digit  6.3 cm          Quick Dash - 04/19/19 0001    Open a tight or new jar  No difficulty    Do heavy household chores (wash walls, wash floors)  No difficulty    Carry a shopping bag or briefcase  No difficulty    Wash your back  No difficulty    Use a knife to cut food  No difficulty    Recreational activities in which you take some force  or impact through your arm, shoulder, or hand (golf, hammering, tennis)  No difficulty    During the past week, to what extent has your arm, shoulder or hand problem interfered with your normal social activities with family, friends, neighbors, or groups?  Not at all    During the past week, to what extent has your arm, shoulder or hand problem limited your work or other regular daily activities  Not at all    Arm, shoulder, or hand pain.  None     Tingling (pins and needles) in your arm, shoulder, or hand  None    Difficulty Sleeping  No difficulty    DASH Score  0 %        Objective measurements completed on examination: See above findings.     Patient was instructed today in a home exercise program today for post op shoulder range of motion. These included active assist shoulder flexion in sitting, scapular retraction, wall walking with shoulder abduction, and hands behind head external rotation.  She was encouraged to do these twice a day, holding 3 seconds and repeating 5 times when permitted by her physician.       PT Education - 04/19/19 1056    Education Details  Lymphedema risk reduction and post op shoulder ROM HEP    Person(s) Educated  Patient    Methods  Explanation;Demonstration;Handout    Comprehension  Returned demonstration;Verbalized understanding          PT Long Term Goals - 04/19/19 1104      PT LONG TERM GOAL #1   Title  Patient will demonstrate she has regained full shoulder ROM and function post operatively compared to baselines.    Time  8    Period  Weeks    Status  New      Breast Clinic Goals - 04/19/19 1103      Patient will be able to verbalize understanding of pertinent lymphedema risk reduction practices relevant to her diagnosis specifically related to skin care.   Time  1    Period  Days    Status  Achieved      Patient will be able to return demonstrate and/or verbalize understanding of the post-op home exercise program related to regaining shoulder range of motion.   Time  1    Period  Days    Status  Achieved      Patient will be able to verbalize understanding of the importance of attending the postoperative After Breast Cancer Class for further lymphedema risk reduction education and therapeutic exercise.   Time  1    Period  Days    Status  Achieved            Plan - 04/19/19 1056    Clinical Impression Statement  Patient was diagnosed on 01/09/2019 with left  grade III triple negative invasive ductal carcinoma breast cancer. It measures 7 mm and is located in the upper inner quadrant with a Ki67 of 40%. Her multidisciplinary medical team met prior to her assessments to determine a recommended treatment plan. She is planning to have a left lumpectomy and sentinel node biopsy followed by chemotherapy if mass is > 5 mm, radiation, and genetic testing. She will benefit from a post op PT assessment to determine needs.    Stability/Clinical Decision Making  Stable/Uncomplicated    Clinical Decision Making  Low    Rehab Potential  Excellent    PT Frequency  --   Eval and 1  f/u visit   PT Treatment/Interventions  ADLs/Self Care Home Management;Therapeutic exercise;Patient/family education    PT Next Visit Plan  Will reassess 3-4 weeks post op to determine needs    PT Home Exercise Plan  Post op shoulder ROM HEP    Consulted and Agree with Plan of Care  Patient       Patient will benefit from skilled therapeutic intervention in order to improve the following deficits and impairments:  Postural dysfunction, Decreased knowledge of precautions, Pain, Impaired UE functional use, Decreased range of motion  Visit Diagnosis: Malignant neoplasm of upper-inner quadrant of left breast in female, estrogen receptor negative ([place]) - Plan: PT plan of care cert/re-cert  Abnormal posture - Plan: PT plan of care cert/re-cert   Patient will follow up at outpatient cancer rehab 3-4 weeks following surgery.  If the patient requires physical therapy at that time, a specific plan will be dictated and sent to the referring physician for approval. The patient was educated today on appropriate basic range of motion exercises to begin post operatively and the importance of attending the After Breast Cancer class following surgery.  Patient was educated today on lymphedema risk reduction practices as it pertains to recommendations that will benefit the patient immediately following  surgery.  She verbalized good understanding.    Problem List Patient Active Problem List   Diagnosis Date Noted  . Malignant neoplasm of upper-inner quadrant of left breast in female, estrogen receptor negative ([place]) 04/18/2019  . Moderate obstructive sleep apnea 03/02/2016  . [place] 10/14/2015   Annia Friendly, PT 04/19/19 11:53 AM  [place], [place], [address] Phone: [phone]   Fax:  [phone]  Name: Laura Fisher MRN: [id number] Date of Birth: 08-27-1955

## 2019-04-21 ENCOUNTER — Other Ambulatory Visit: Payer: Self-pay | Admitting: General Surgery

## 2019-04-21 DIAGNOSIS — C50212 Malignant neoplasm of upper-inner quadrant of left female breast: Secondary | ICD-10-CM

## 2019-04-21 DIAGNOSIS — Z171 Estrogen receptor negative status [ER-]: Secondary | ICD-10-CM

## 2019-04-24 ENCOUNTER — Ambulatory Visit (HOSPITAL_BASED_OUTPATIENT_CLINIC_OR_DEPARTMENT_OTHER)
Admission: RE | Admit: 2019-04-24 | Discharge: 2019-04-24 | Disposition: A | Discharge status: Home / Self Care | Payer: BC Managed Care – PPO | Source: Ambulatory Visit | Attending: Cardiology | Admitting: Cardiology

## 2019-04-24 ENCOUNTER — Other Ambulatory Visit: Payer: Self-pay

## 2019-04-24 ENCOUNTER — Ambulatory Visit (HOSPITAL_COMMUNITY)
Admission: RE | Admit: 2019-04-24 | Discharge: 2019-04-24 | Disposition: A | Discharge status: Home / Self Care | Payer: BC Managed Care – PPO | Source: Ambulatory Visit | Attending: Cardiology | Admitting: Cardiology

## 2019-04-24 ENCOUNTER — Other Ambulatory Visit (HOSPITAL_COMMUNITY)
Admission: RE | Admit: 2019-04-24 | Discharge: 2019-04-24 | Disposition: A | Discharge status: Home / Self Care | Payer: BC Managed Care – PPO | Source: Ambulatory Visit | Attending: General Surgery | Admitting: General Surgery

## 2019-04-24 VITALS — BP 154/98 | HR 74 | Wt 192.6 lb

## 2019-04-24 DIAGNOSIS — I493 Ventricular premature depolarization: Secondary | ICD-10-CM | POA: Diagnosis not present

## 2019-04-24 DIAGNOSIS — Z806 Family history of leukemia: Secondary | ICD-10-CM | POA: Insufficient documentation

## 2019-04-24 DIAGNOSIS — Z171 Estrogen receptor negative status [ER-]: Secondary | ICD-10-CM | POA: Diagnosis not present

## 2019-04-24 DIAGNOSIS — Z833 Family history of diabetes mellitus: Secondary | ICD-10-CM | POA: Diagnosis not present

## 2019-04-24 DIAGNOSIS — K219 Gastro-esophageal reflux disease without esophagitis: Secondary | ICD-10-CM | POA: Diagnosis not present

## 2019-04-24 DIAGNOSIS — R9431 Abnormal electrocardiogram [ECG] [EKG]: Secondary | ICD-10-CM | POA: Insufficient documentation

## 2019-04-24 DIAGNOSIS — Z79899 Other long term (current) drug therapy: Secondary | ICD-10-CM | POA: Insufficient documentation

## 2019-04-24 DIAGNOSIS — Z800 Family history of malignant neoplasm of digestive organs: Secondary | ICD-10-CM | POA: Insufficient documentation

## 2019-04-24 DIAGNOSIS — Z809 Family history of malignant neoplasm, unspecified: Secondary | ICD-10-CM | POA: Insufficient documentation

## 2019-04-24 DIAGNOSIS — C50212 Malignant neoplasm of upper-inner quadrant of left female breast: Secondary | ICD-10-CM | POA: Insufficient documentation

## 2019-04-24 DIAGNOSIS — Z8249 Family history of ischemic heart disease and other diseases of the circulatory system: Secondary | ICD-10-CM | POA: Insufficient documentation

## 2019-04-24 DIAGNOSIS — Z20828 Contact with and (suspected) exposure to other viral communicable diseases: Secondary | ICD-10-CM | POA: Diagnosis not present

## 2019-04-24 DIAGNOSIS — I10 Essential (primary) hypertension: Secondary | ICD-10-CM | POA: Insufficient documentation

## 2019-04-24 DIAGNOSIS — G4733 Obstructive sleep apnea (adult) (pediatric): Secondary | ICD-10-CM | POA: Diagnosis not present

## 2019-04-24 MED ORDER — CARVEDILOL 3.125 MG PO TABS: 3.1250 mg | ORAL_TABLET | Freq: Two times a day (BID) | ORAL | 5 refills | Status: AC

## 2019-04-24 NOTE — Progress Notes (Signed)
  Echocardiogram 2D Echocardiogram has been performed.  Burnett Kanaris 04/24/2019, 3:08 PM

## 2019-04-24 NOTE — Patient Instructions (Addendum)
No lab work done today.  We did print you out a prescription for Carvedilol 3.125mg (1 tab) by mouth two times daily. You should take this only if you decide to take adriamycin.   Your physician recommends that you schedule a follow-up appointment after your 3rd cycle of adriamycin if you choose to start it. Please contact us to schedule that appointment as well as schedule an echo(can be done same day as appointment)  At the [facility], you and your health needs are our priority. As part of our continuing mission to provide you with exceptional heart care, we have created designated Provider Care Teams. These Care Teams include your primary Cardiologist (physician) and Advanced Practice Providers (APPs- Physician Assistants and Nurse Practitioners) who all work together to provide you with the care you need, when you need it.   You may see any of the following providers on your designated Care Team at your next follow up: Marland Kitchen Dr Glori Bickers . Dr Loralie Champagne . Darrick Grinder, NP   Please be sure to bring in all your medications bottles to every appointment.

## 2019-04-25 ENCOUNTER — Encounter (HOSPITAL_COMMUNITY): Payer: Self-pay

## 2019-04-25 ENCOUNTER — Other Ambulatory Visit: Payer: Self-pay | Admitting: General Surgery

## 2019-04-25 ENCOUNTER — Telehealth: Payer: Self-pay

## 2019-04-25 ENCOUNTER — Encounter (HOSPITAL_COMMUNITY)
Admission: RE | Admit: 2019-04-25 | Discharge: 2019-04-25 | Disposition: A | Discharge status: Home / Self Care | Payer: BC Managed Care – PPO | Source: Ambulatory Visit | Attending: General Surgery | Admitting: General Surgery

## 2019-04-25 DIAGNOSIS — Z800 Family history of malignant neoplasm of digestive organs: Secondary | ICD-10-CM | POA: Diagnosis not present

## 2019-04-25 DIAGNOSIS — M1711 Unilateral primary osteoarthritis, right knee: Secondary | ICD-10-CM | POA: Insufficient documentation

## 2019-04-25 DIAGNOSIS — M8580 Other specified disorders of bone density and structure, unspecified site: Secondary | ICD-10-CM | POA: Insufficient documentation

## 2019-04-25 DIAGNOSIS — Z803 Family history of malignant neoplasm of breast: Secondary | ICD-10-CM | POA: Diagnosis not present

## 2019-04-25 DIAGNOSIS — Z6831 Body mass index (BMI) 31.0-31.9, adult: Secondary | ICD-10-CM | POA: Insufficient documentation

## 2019-04-25 DIAGNOSIS — R7303 Prediabetes: Secondary | ICD-10-CM | POA: Insufficient documentation

## 2019-04-25 DIAGNOSIS — C50912 Malignant neoplasm of unspecified site of left female breast: Secondary | ICD-10-CM | POA: Diagnosis not present

## 2019-04-25 DIAGNOSIS — Z01812 Encounter for preprocedural laboratory examination: Secondary | ICD-10-CM | POA: Diagnosis not present

## 2019-04-25 DIAGNOSIS — Z806 Family history of leukemia: Secondary | ICD-10-CM | POA: Diagnosis not present

## 2019-04-25 DIAGNOSIS — Z79899 Other long term (current) drug therapy: Secondary | ICD-10-CM | POA: Diagnosis not present

## 2019-04-25 DIAGNOSIS — G4733 Obstructive sleep apnea (adult) (pediatric): Secondary | ICD-10-CM | POA: Insufficient documentation

## 2019-04-25 DIAGNOSIS — E669 Obesity, unspecified: Secondary | ICD-10-CM | POA: Insufficient documentation

## 2019-04-25 DIAGNOSIS — K219 Gastro-esophageal reflux disease without esophagitis: Secondary | ICD-10-CM | POA: Diagnosis not present

## 2019-04-25 DIAGNOSIS — I10 Essential (primary) hypertension: Secondary | ICD-10-CM | POA: Diagnosis not present

## 2019-04-25 HISTORY — DX: Other complications of anesthesia, initial encounter: T88.59XA

## 2019-04-25 HISTORY — DX: Personal history of other diseases of the digestive system: Z87.19

## 2019-04-25 HISTORY — DX: Other specified postprocedural states: R11.2

## 2019-04-25 HISTORY — DX: Family history of other specified conditions: Z84.89

## 2019-04-25 HISTORY — DX: Prediabetes: R73.03

## 2019-04-25 HISTORY — DX: Other specified postprocedural states: Z98.890

## 2019-04-25 HISTORY — DX: Cardiac murmur, unspecified: R01.1

## 2019-04-25 HISTORY — DX: Fatty (change of) liver, not elsewhere classified: K76.0

## 2019-04-25 LAB — NOVEL CORONAVIRUS, NAA (HOSP ORDER, SEND-OUT TO REF LAB; TAT 18-24 HRS): SARS-CoV-2, NAA: NOT DETECTED

## 2019-04-25 NOTE — Progress Notes (Signed)
PCP - Dr. Shearon Stalls ([place], [place]) Cardiologist - Dr. Raechel Chute [place], [place])  PPM/ICD - N/A Device Orders -  Rep Notified  Chest x-ray - N/A EKG - 04/24/19 Stress Test - N/A ECHO - 04/24/19 Cardiac Cath - denies  Sleep Study -  CPAP - yes (wears mouth piece)  Fasting Blood Sugar - Pt is Pre-diabetic Checks Blood Sugar _____ times a day  Blood Thinner Instructions: N/A Aspirin Instructions:N/A  ERAS Protcol - clear liquids until 3 hours prior to surgery PRE-SURGERY Ensure - No  COVID TEST- Done 04/24/19, pt states she has been in quarantine since test except to come to this appt.   Anesthesia review: yes, due to pt's mother having Pseudocholinesterase deficiency and that she is having radioactive seed implant.   Have requested LOV from Dr. Darral Dash and any recent heart studies. Have called and left message at Dr. Mauricio Po office about pt being tested in the past for Pseudocholinesterase Deficiency and most recent A1C  Patient denies shortness of breath, fever, cough and chest pain at PAT appointment   Patient verbalized understanding of instructions that were given to them at the PAT appointment. Patient was also instructed that they will need to review over the PAT instructions again at home before surgery.  Pt informed about visitation policy and voiced understanding.

## 2019-04-25 NOTE — Pre-Procedure Instructions (Signed)
Laura Fisher  04/25/2019    Your procedure is scheduled on Thursday, April 27, 2019 at 3:00 PM.   Report to [facility] Entrance "A" Admitting Office at 1:00 PM.   Call this number if you have problems the morning of surgery: [phone]   Questions prior to day of surgery, please call [phone] between 8 & 4 PM.   Remember:  Do not eat food after midnight Wednesday, 04/26/19.  You may drink clear liquids until 12 Noon.  Clear liquids allowed are:    Water, Juice (non-citric and without pulp), Carbonated beverages, Clear Tea, Black Coffee only, Plain Jell-O only, Gatorade and Plain Popsicles only    Take these medicines the morning of surgery with while drinking clear liquids: Carvedilol (Coreg) - if you have started this medication, Fexofenadine (Allegra) - if needed, Pantoprazole (Protonix) - if needed, Tylenol - if needed  Stop Multivitamins and Herbal medications as of today prior to surgery. Do not use NSAIDS (Ibuprofen, Aleve, etc), Aspirin products (BC Powders, Goody's, etc) or Fish Oil prior to surgery.    Do not wear jewelry, make-up or nail polish.  Do not wear lotions, powders, perfumes or deodorant.  Do not shave 48 hours prior to surgery.   Do not bring valuables to the hospital.  [facility] is not responsible for any belongings or valuables.  Contacts, dentures or bridgework may not be worn into surgery.  Leave your suitcase in the car.  After surgery it may be brought to your room.  For patients admitted to the hospital, discharge time will be determined by your treatment team.  Patients discharged the day of surgery will not be allowed to drive home.   [facility] - Preparing for Surgery  Before surgery, you can play an important role.  Because skin is not sterile, your skin needs to be as free of germs as possible.  You can reduce the number of germs on you skin by washing with CHG (chlorahexidine gluconate) soap before surgery.  CHG is an  antiseptic cleaner which kills germs and bonds with the skin to continue killing germs even after washing.  Oral Hygiene is also important in reducing the risk of infection.  Remember to brush your teeth with your regular toothpaste the morning of surgery.  Please DO NOT use if you have an allergy to CHG or antibacterial soaps.  If your skin becomes reddened/irritated stop using the CHG and inform your nurse when you arrive at Short Stay.  Do not shave (including legs and underarms) for at least 48 hours prior to the first CHG shower.  You may shave your face.  Please follow these instructions carefully:   1.  Shower with CHG Soap the night before surgery and the morning of Surgery.  2.  If you choose to wash your hair, wash your hair first as usual with your normal shampoo.  3.  After you shampoo, rinse your hair and body thoroughly to remove the shampoo. 4.  Use CHG as you would any other liquid soap.  You can apply chg directly to the skin and wash gently with a      scrungie or washcloth.           5.  Apply the CHG Soap to your body ONLY FROM THE NECK DOWN.   Do not use on open wounds or open sores. Avoid contact with your eyes, ears, mouth and genitals (private parts).  Wash genitals (private parts) with your normal soap -  do this prior to using CHG soap.  6.  Wash thoroughly, paying special attention to the area where your surgery will be performed.  7.  Thoroughly rinse your body with warm water from the neck down.  8.  DO NOT shower/wash with your normal soap after using and rinsing off the CHG Soap.  9.  Pat yourself dry with a clean towel.            10.  Wear clean pajamas.            11.  Place clean sheets on your bed the night of your first shower and do not sleep with pets.  Day of Surgery  Shower as above. Do not apply any lotions/deodorants the morning of surgery.   Please wear clean clothes to the hospital. Remember to brush your teeth with toothpaste.   Please read  over the fact sheets that you were given.

## 2019-04-25 NOTE — Progress Notes (Signed)
Oncology: Dr. Lindi Adie  63 y.o. with history of HTN and OSA and now left breast cancer presents for cardio-oncology evaluation.  Left breast cancer diagnosed in 9/20.  ER-/PR-/HER2-.  She will have lumpectomy.  She will need chemotherapy, regimen not decided yet.   Generally in good health, no prior cardiac history.  No exertional dyspnea or chest pain.  She is a nonsmoker.  No family history of premature CAD. She is concerned about the cardiac risk from Adriamycin therapy.   ECG: NSR, PVC, poor RWP (personally reviewed)  PMH: 1. GERD 2. HTN 3. OSA: Uses oral device 4. Borderline hyperlipidemia 5. Breast cancer: Left breast cancer diagnosed in 9/20.  ER-/PR-/HER2-.  She will have lumpectomy.  She will need chemotherapy, regimen not decided yet.  - Echo (9/20): EF 55-60%, GLS -19%, normal RV size and systolic function.  6. Nuclear stress test about 4 yrs ago was negative.   Social History   Socioeconomic History  . Marital status: Married    Spouse name: Not on file  . Number of children: 2  . Years of education: Not on file  . Highest education level: Not on file  Occupational History  . Occupation: Art therapist  Social Needs  . Financial resource strain: Not on file  . Food insecurity    Worry: Not on file    Inability: Not on file  . Transportation needs    Medical: Not on file    Non-medical: Not on file  Tobacco Use  . Smoking status: Never Smoker  . Smokeless tobacco: Never Used  Substance and Sexual Activity  . Alcohol use: Never    Frequency: Never  . Drug use: Never  . Sexual activity: Yes    Partners: Male  Lifestyle  . Physical activity    Days per week: Not on file    Minutes per session: Not on file  . Stress: Not on file  Relationships  . Social Herbalist on phone: Not on file    Gets together: Not on file    Attends religious service: Not on file    Active member of club or organization: Not on file    Attends meetings of clubs or  organizations: Not on file    Relationship status: Not on file  . Intimate partner violence    Fear of current or ex partner: Not on file    Emotionally abused: Not on file    Physically abused: Not on file    Forced sexual activity: Not on file  Other Topics Concern  . Not on file  Social History Narrative  . Not on file   Family History  Problem Relation Age of Onset  . Tongue cancer Mother 75  . Heart disease Mother   . Heart disease Father   . Diabetes Sister   . Pancreatic cancer Other        Nephew  . Stomach cancer Maternal Aunt   . Leukemia Paternal Aunt   . Breast cancer Cousin 45  . Cancer Cousin        unk type  . Colon cancer Neg Hx   . Colon polyps Neg Hx   . Esophageal cancer Neg Hx   . Rectal cancer Neg Hx    ROS: All systems reviewed and negative except as per HPI.   Current Outpatient Medications  Medication Sig Dispense Refill  . acetaminophen (TYLENOL) 325 MG tablet Take 650 mg by mouth every 6 (six) hours as needed  for mild pain or headache.     . benazepril (LOTENSIN) 20 MG tablet Take 20 mg by mouth daily.    . calcium elemental as carbonate (TUMS ULTRA 1000) 400 MG chewable tablet Chew 1,000-2,000 mg by mouth 3 (three) times daily as needed for heartburn.    . Carboxymethylcell-Hypromellose (GENTEAL OP) Place 1 drop into both eyes at bedtime.     . Cholecalciferol (VITAMIN D) 125 MCG (5000 UT) CAPS Take 5,000 Units by mouth daily.    . clobetasol ointment (TEMOVATE) 9.89 % Apply 1 application topically 2 (two) times daily as needed (rash).     Marland Kitchen desonide (DESOWEN) 0.05 % ointment Apply 1 application topically 2 (two) times daily as needed (rash).     . fexofenadine (ALLEGRA) 180 MG tablet Take 90-180 mg by mouth daily as needed for allergies.     . Glucosamine Sulfate 1000 MG TABS Take 1,000 mg by mouth 2 (two) times daily.     Marland Kitchen Ketotifen Fumarate (ALLERGY EYE DROPS OP) Place 1 drop into both eyes 2 (two) times daily as needed (allergies).     .  Magnesium 250 MG TABS Take 250 mg by mouth daily.     . Multiple Vitamin (MULTIVITAMIN) capsule Take 1 capsule by mouth daily.    . pantoprazole (PROTONIX) 40 MG tablet Take 1 tablet (40 mg total) by mouth daily. (Patient taking differently: Take 40 mg by mouth daily as needed (heartburn). ) 30 tablet 11  . Polyethyl Glycol-Propyl Glycol (SYSTANE OP) Place 2-4 drops into both eyes every 8 (eight) hours as needed.     . Probiotic Product (PROBIOTIC-10 PO) Take 1 tablet by mouth 2 (two) times daily.     . Turmeric 500 MG CAPS Take 500 mg by mouth daily.     . carvedilol (COREG) 3.125 MG tablet Take 1 tablet (3.125 mg total) by mouth 2 (two) times daily with a meal. 60 tablet 5   No current facility-administered medications for this encounter.    BP (!) 154/98   Pulse 74   Wt 87.4 kg (192 lb 9.6 oz)   SpO2 98%   BMI 32.05 kg/m  General: NAD Neck: No JVD, no thyromegaly or thyroid nodule.  Lungs: Clear to auscultation bilaterally with normal respiratory effort. CV: Nondisplaced PMI.  Heart regular S1/S2, no S3/S4, no murmur.  No peripheral edema.  No carotid bruit.  Normal pedal pulses.  Abdomen: Soft, nontender, no hepatosplenomegaly, no distention.  Skin: Intact without lesions or rashes.  Neurologic: Alert and oriented x 3.  Psych: Normal affect. Extremities: No clubbing or cyanosis.  HEENT: Normal.   Assessment/Plan: 1. Breast cancer: Recently diagnosed. She will possibly be starting on a chemotherapy regimen including Adriamycin.  We discussed the cardiac risk of Adriamycin today.  I reviewed her echo today, her heart looks structurally normal.  She has not prior cardiac history.  I do not think that her risk from Adriamycin would be significantly elevated from the general population risk and that it would be a reasonable option for her.  - If she starts Adriamycin-based chemotherapy, I will arrange for echo after the 3rd cycle then echo again at 6 months to assess for late  manifestations.  - She will continue lisinopril.  - There is some data for prophylactic beta blocker use, and given elevated BP, I would like her to start on Coreg 3.125 mg bid if she is going to get Adriamycin.  2. HTN: she will continue lisinopril.  As above, if  she decides to go with Adriamycin-based chemo, she will start on Coreg 3.125 mg bid.    If she gets Adriamycin, will have her return for echo and office visit after 3rd cycle.   Loralie Champagne 04/25/2019

## 2019-04-25 NOTE — Telephone Encounter (Signed)
RN returned call.  Voicemail left for return call.  

## 2019-04-25 NOTE — Telephone Encounter (Signed)
Nutrition Assessment  Reason for Assessment:  Pt attended Breast Clinic on 9/23 and was given nutrition packet by nurse navigator  ASSESSMENT:  63 year old female with new diagnosis of left breast cancer.  Planning lumpectomy, adjuvant chemotherapy and radiation therapy.    Spoke with patient via phone to introduce self and service at [facility].  Patient reports that her appetite has not been good due to stress of new diagnosis of breast cancer.  Reports that it is coming back.  Had oatmeal and blueberries this am for breakfast.    Medications:  Calcium carbonate, vit D, MVI  Labs: reviewed  Anthropometrics:   Height: 65 inches Weight: 192 lb 9.6 oz  BMI: 32   NUTRITION DIAGNOSIS: Food and nutrition related knowledge deficit related to new diagnosis of breast cancer as evidenced by no prior need for nutrition related information.  INTERVENTION:   Discussed packet of information regarding nutritional tips for breast cancer patients.  Patient with multiple questions regarding diet and nutrition.  Questions answered.  Contact information provided and patient knows to contact me with questions/concerns.    MONITORING, EVALUATION, and GOAL: Pt will consume a healthy plant based diet to maintain lean body mass throughout treatment.   Laura Fisher, [place], [place] Registered Dietitian [phone] (pager)

## 2019-04-26 ENCOUNTER — Telehealth: Payer: Self-pay

## 2019-04-26 ENCOUNTER — Encounter: Payer: BC Managed Care – PPO | Admitting: Internal Medicine

## 2019-04-26 ENCOUNTER — Other Ambulatory Visit: Payer: Self-pay

## 2019-04-26 ENCOUNTER — Encounter (HOSPITAL_COMMUNITY): Payer: Self-pay

## 2019-04-26 DIAGNOSIS — N644 Mastodynia: Secondary | ICD-10-CM

## 2019-04-26 NOTE — Progress Notes (Signed)
Anesthesia Chart Review:  Case: Laura Fisher Date/Time: 04/27/19 1445   Procedures:      LEFT BREAST LUMPECTOMY WITH RADIOACTIVE SEED AND SENTINEL LYMPH NODE BIOPSY (Left Breast)     INSERTION PORT-A-CATH WITH ULTRASOUND (N/A )   Anesthesia type: General   Pre-op diagnosis: LEFT BREAST CANCER   Location: [facility] OR ROOM 02 / [facility]   Surgeon: Stark Klein, MD      DISCUSSION: Patient is a 63 year old female scheduled for the above procedure.  History includes left breast cancer, never smoker, post-operative N/V ("terrible" nausea after miscarriage and T&A), GERD, HTN, OSA (uses oral device), murmur ("benign" per patient), pre-diabetes, IBS, fatty liver. FAMILY HISTORY OF PSEUDOCHOLINESTERASE DEFICIENCY.  Last A1c requested from her PCP Dr. Bing Neighbors also if any lab testing for pseudocholinesterase deficiency given family history, but records are still pending.  Negative COVID-19 test on 04/24/19. Anesthesia team to evaluate on the day of surgery.   VS: BP 137/74   Pulse (!) 55   Temp 36.8 C   Resp 18   Ht 5\' 5"  (1.651 m)   Wt 86.5 kg   SpO2 96%   BMI 31.73 kg/m   PROVIDERS: Kennieth Rad, MD is PCP Angelina Sheriff, [place]) Raechel Chute, MD is cardiologist ([place]). Last visit 01/17/19 for OSA and palpitations follow-up.   LABS: Labs reviewed: Acceptable for surgery. (all labs ordered are listed, but only abnormal results are displayed)  Labs Reviewed - No data to display    EKG: 04/24/19: Sinus rhythm with pvc Poor R wave progression Abnormal ECG No previous ECGs available Confirmed by Buford Dresser [id number]) on 04/24/2019 8:52:25 PM   CV: Echo 04/24/19: IMPRESSIONS  1. Left ventricular ejection fraction, by visual estimation, is 55 to 60%. Normal left ventricular size. There is no left ventricular hypertrophy. GLS -19%.  2. Left ventricular diastolic Doppler parameters are consistent with impaired relaxation pattern of LV diastolic filling.  3.  Mild mitral annular calcification.  4. The mitral valve is normal in structure. No evidence of mitral valve regurgitation. No evidence of mitral stenosis.  5. The aortic valve is tricuspid Aortic valve regurgitation was not visualized by color flow Doppler. Mild aortic valve sclerosis without stenosis.  6. Left atrial size was normal.  7. Right atrial size was normal.  8. Global right ventricle has normal systolic function.The right ventricular size is normal. No increase in right ventricular wall thickness.  9. The inferior vena cava is normal in size with greater than 50% respiratory variability, suggesting right atrial pressure of 3 mmHg. 10. The tricuspid valve is normal in structure. Tricuspid valve regurgitation was not visualized by color flow Doppler. 11. TR signal is inadequate for assessing pulmonary artery systolic pressure.  According to 01/17/19 note by Dr. Darral Dash, other testing has included: - Holter 10/06/13: "several mild ST episodes while exercising on the elliptical and while 'eating noodle' and drinking coffee. One short asympt SVT run". He notes "holter monitor which was grossly unremarkable"  - [facility] (for evaluation of chest pain) 10/10/13: "no ischemia on 95% PMHR, deconditioned, poor exercise tolerance" He notes chest pain "Currently resolved."   Past Medical History:  Diagnosis Date  . Allergy    seasonal  . Anal fissure   . Arthritis    right knee  . Cancer [facility])    breast left side dx 04/14/19  . Colon polyp   . Complication of anesthesia    Family history of pseudocholinesterase deficiency  . Family history  of adverse reaction to anesthesia    pt's mother had pseudocholinesterase deficiency  . Family history of breast cancer   . Family history of leukemia   . Family history of pancreatic cancer   . Family history of stomach cancer   . Fatty liver   . GERD (gastroesophageal reflux disease)   . Heart murmur    benign per pt  . High blood  pressure   . History of IBS   . Obesity   . Osteopenia   . PONV (postoperative nausea and vomiting)    "terrible nausea" with a D&C for a miscarriage and after T&A  . Pre-diabetes   . Sleep apnea    use a oral device    Past Surgical History:  Procedure Laterality Date  . COLONOSCOPY  2015  . POLYPECTOMY    . TONSILECTOMY, ADENOIDECTOMY, BILATERAL MYRINGOTOMY AND TUBES     never had tubes in ears  . WISDOM TOOTH EXTRACTION    . WRIST SURGERY Bilateral     MEDICATIONS: . acetaminophen (TYLENOL) 325 MG tablet  . benazepril (LOTENSIN) 20 MG tablet  . calcium elemental as carbonate (TUMS ULTRA 1000) 400 MG chewable tablet  . Carboxymethylcell-Hypromellose ([place] OP)  . carvedilol (COREG) 3.125 MG tablet  . Cholecalciferol (VITAMIN D) 125 MCG (5000 UT) CAPS  . clobetasol ointment (TEMOVATE) 0.05 %  . desonide (DESOWEN) 0.05 % ointment  . fexofenadine (ALLEGRA) 180 MG tablet  . Glucosamine Sulfate 1000 MG TABS  . Ketotifen Fumarate (ALLERGY EYE DROPS OP)  . Magnesium 250 MG TABS  . Multiple Vitamin (MULTIVITAMIN) capsule  . pantoprazole (PROTONIX) 40 MG tablet  . Polyethyl Glycol-Propyl Glycol (SYSTANE OP)  . Probiotic Product (PROBIOTIC-10 PO)  . Turmeric 500 MG CAPS   No current facility-administered medications for this encounter.      Myra Gianotti, PA-C Surgical Short Stay/Anesthesiology [facility] Phone [phone] [facility] Phone [phone] 04/26/2019 4:15 PM

## 2019-04-26 NOTE — Anesthesia Preprocedure Evaluation (Addendum)
Anesthesia Evaluation  Patient identified by MRN, date of birth, ID band Patient awake    Reviewed: Allergy & Precautions, H&P , NPO status , Patient's Chart, lab work & pertinent test results, reviewed documented beta blocker date and time   History of Anesthesia Complications (+) Family history of anesthesia reaction and history of anesthetic complications  Airway Mallampati: II  TM Distance: >3 FB Neck ROM: full    Dental no notable dental hx. (+) Teeth Intact, Dental Advisory Given   Pulmonary sleep apnea and Continuous Positive Airway Pressure Ventilation ,    breath sounds clear to auscultation   + intubated    Cardiovascular Exercise Tolerance: Good hypertension, negative cardio ROS   Rhythm:regular Rate:Normal  EKG: 04/24/19: Sinus rhythm with pvc Poor R wave progression Abnormal ECG No previous ECGs available Confirmed by Buford Dresser [phone]) on 04/24/2019 8:52:25 PM   CV: Echo 04/24/19: IMPRESSIONS 1. Left ventricular ejection fraction, by visual estimation, is 55 to 60%. Normal left ventricular size. There is no left ventricular hypertrophy. GLS -19%. 2. Left ventricular diastolic Doppler parameters are consistent with impaired relaxation pattern of LV diastolic filling. 3. Mild mitral annular calcification. 4. The mitral valve is normal in structure. No evidence of mitral valve regurgitation. No evidence of mitral stenosis.   Neuro/Psych negative neurological ROS  negative psych ROS   GI/Hepatic negative GI ROS, Neg liver ROS, GERD  ,  Endo/Other  negative endocrine ROS  Renal/GU negative Renal ROS  negative genitourinary   Musculoskeletal  (+) Arthritis , Osteoarthritis,    Abdominal (+) + obese,   Peds  Hematology negative hematology ROS (+)   Anesthesia Other Findings   Reproductive/Obstetrics negative OB ROS                            Anesthesia  Physical Anesthesia Plan  ASA: II  Anesthesia Plan: General   Post-op Pain Management: GA combined w/ Regional for post-op pain   Induction: Intravenous  PONV Risk Score and Plan: 3 and Ondansetron, Dexamethasone, Treatment may vary due to age or medical condition and Midazolam  Airway Management Planned: Oral ETT and LMA  Additional Equipment:   Intra-op Plan:   Post-operative Plan: Extubation in OR  Informed Consent: I have reviewed the patients History and Physical, chart, labs and discussed the procedure including the risks, benefits and alternatives for the proposed anesthesia with the patient or authorized representative who has indicated his/her understanding and acceptance.     Dental Advisory Given  Plan Discussed with: CRNA, Anesthesiologist and Surgeon  Anesthesia Plan Comments: (PAT note written 04/26/2019 by Myra Gianotti, PA-C. Family history of pseudocholinesterase deficiency. )       Anesthesia Quick Evaluation

## 2019-04-26 NOTE — Telephone Encounter (Signed)
RN spoke with patient.  Patient with concerns with right breast.  Pt reports that last mammogram was in summer and voiced concerns that something has occurred since then in right breast.  Patient reports small breast discomfort to right breast.    RN reviewed concerns with MD.  MD collaborated with surgeon.  No indications for MRI at this time based on assessment and tumor board appointment, but based on right breast discomfort we can do a diagnostic right breast mammogram.  Pt voiced understanding.  Pt with questions regarding upcoming lumpectomy on 10/1, RN answered questions, but encouraged patient to speak with surgeon prior to lumpectomy.

## 2019-04-27 ENCOUNTER — Encounter (HOSPITAL_COMMUNITY)
Admission: RE | Disposition: A | Discharge status: Home / Self Care | Payer: Self-pay | Source: Home / Self Care | Attending: General Surgery

## 2019-04-27 ENCOUNTER — Ambulatory Visit (HOSPITAL_COMMUNITY)
Admission: RE | Admit: 2019-04-27 | Discharge: 2019-04-27 | Disposition: A | Discharge status: Home / Self Care | Payer: Medicare Other | Attending: General Surgery | Admitting: General Surgery

## 2019-04-27 ENCOUNTER — Telehealth: Payer: Self-pay | Admitting: *Deleted

## 2019-04-27 ENCOUNTER — Ambulatory Visit
Admission: RE | Admit: 2019-04-27 | Discharge: 2019-04-27 | Disposition: A | Discharge status: Home / Self Care | Payer: Medicare Other | Source: Ambulatory Visit | Attending: General Surgery | Admitting: General Surgery

## 2019-04-27 ENCOUNTER — Ambulatory Visit (HOSPITAL_COMMUNITY): Payer: Medicare Other

## 2019-04-27 ENCOUNTER — Other Ambulatory Visit: Payer: Self-pay

## 2019-04-27 ENCOUNTER — Inpatient Hospital Stay: Payer: Medicare Other | Attending: Hematology and Oncology

## 2019-04-27 ENCOUNTER — Ambulatory Visit (HOSPITAL_COMMUNITY): Payer: Medicare Other | Admitting: Physician Assistant

## 2019-04-27 ENCOUNTER — Encounter (HOSPITAL_COMMUNITY)
Admission: RE | Admit: 2019-04-27 | Discharge: 2019-04-27 | Disposition: A | Discharge status: Home / Self Care | Payer: Medicare Other | Source: Ambulatory Visit | Attending: General Surgery | Admitting: General Surgery

## 2019-04-27 ENCOUNTER — Ambulatory Visit (HOSPITAL_COMMUNITY): Payer: Medicare Other | Admitting: Anesthesiology

## 2019-04-27 ENCOUNTER — Encounter (HOSPITAL_COMMUNITY): Payer: Self-pay

## 2019-04-27 DIAGNOSIS — Z818 Family history of other mental and behavioral disorders: Secondary | ICD-10-CM | POA: Insufficient documentation

## 2019-04-27 DIAGNOSIS — Z8249 Family history of ischemic heart disease and other diseases of the circulatory system: Secondary | ICD-10-CM | POA: Insufficient documentation

## 2019-04-27 DIAGNOSIS — Z8261 Family history of arthritis: Secondary | ICD-10-CM | POA: Insufficient documentation

## 2019-04-27 DIAGNOSIS — I059 Rheumatic mitral valve disease, unspecified: Secondary | ICD-10-CM | POA: Insufficient documentation

## 2019-04-27 DIAGNOSIS — Z809 Family history of malignant neoplasm, unspecified: Secondary | ICD-10-CM | POA: Diagnosis not present

## 2019-04-27 DIAGNOSIS — I10 Essential (primary) hypertension: Secondary | ICD-10-CM | POA: Diagnosis not present

## 2019-04-27 DIAGNOSIS — R011 Cardiac murmur, unspecified: Secondary | ICD-10-CM | POA: Insufficient documentation

## 2019-04-27 DIAGNOSIS — Z171 Estrogen receptor negative status [ER-]: Secondary | ICD-10-CM | POA: Insufficient documentation

## 2019-04-27 DIAGNOSIS — Z419 Encounter for procedure for purposes other than remedying health state, unspecified: Secondary | ICD-10-CM

## 2019-04-27 DIAGNOSIS — Z6831 Body mass index (BMI) 31.0-31.9, adult: Secondary | ICD-10-CM | POA: Insufficient documentation

## 2019-04-27 DIAGNOSIS — C50212 Malignant neoplasm of upper-inner quadrant of left female breast: Secondary | ICD-10-CM

## 2019-04-27 DIAGNOSIS — K219 Gastro-esophageal reflux disease without esophagitis: Secondary | ICD-10-CM | POA: Diagnosis not present

## 2019-04-27 DIAGNOSIS — G4730 Sleep apnea, unspecified: Secondary | ICD-10-CM | POA: Insufficient documentation

## 2019-04-27 DIAGNOSIS — Z833 Family history of diabetes mellitus: Secondary | ICD-10-CM | POA: Insufficient documentation

## 2019-04-27 DIAGNOSIS — M1990 Unspecified osteoarthritis, unspecified site: Secondary | ICD-10-CM | POA: Diagnosis not present

## 2019-04-27 DIAGNOSIS — Z823 Family history of stroke: Secondary | ICD-10-CM | POA: Diagnosis not present

## 2019-04-27 DIAGNOSIS — Z452 Encounter for adjustment and management of vascular access device: Secondary | ICD-10-CM | POA: Insufficient documentation

## 2019-04-27 DIAGNOSIS — R9431 Abnormal electrocardiogram [ECG] [EKG]: Secondary | ICD-10-CM | POA: Insufficient documentation

## 2019-04-27 DIAGNOSIS — E669 Obesity, unspecified: Secondary | ICD-10-CM | POA: Diagnosis not present

## 2019-04-27 DIAGNOSIS — Z95828 Presence of other vascular implants and grafts: Secondary | ICD-10-CM

## 2019-04-27 DIAGNOSIS — C50912 Malignant neoplasm of unspecified site of left female breast: Secondary | ICD-10-CM | POA: Diagnosis present

## 2019-04-27 HISTORY — PX: BREAST LUMPECTOMY WITH RADIOACTIVE SEED AND SENTINEL LYMPH NODE BIOPSY: SHX6550

## 2019-04-27 HISTORY — PX: PORTACATH PLACEMENT: SHX2246

## 2019-04-27 SURGERY — BREAST LUMPECTOMY WITH RADIOACTIVE SEED AND SENTINEL LYMPH NODE BIOPSY
Anesthesia: General | Site: Chest | Laterality: Right

## 2019-04-27 MED ORDER — GABAPENTIN 300 MG PO CAPS
300.0000 mg | ORAL_CAPSULE | ORAL | Status: AC
  Administered 2019-04-27: 14:00:00 300 mg via ORAL

## 2019-04-27 MED ORDER — OXYCODONE HCL 5 MG/5ML PO SOLN: 5.0000 mg | Freq: Once | ORAL | Status: AC | PRN

## 2019-04-27 MED ORDER — SODIUM CHLORIDE (PF) 0.9 % IJ SOLN
INTRAMUSCULAR | Status: AC
  Filled 2019-04-27: qty 10

## 2019-04-27 MED ORDER — CEFAZOLIN SODIUM-DEXTROSE 2-4 GM/100ML-% IV SOLN
INTRAVENOUS | Status: AC
  Filled 2019-04-27: qty 100

## 2019-04-27 MED ORDER — TRAMADOL HCL 50 MG PO TABS: 50.0000 mg | ORAL_TABLET | Freq: Four times a day (QID) | ORAL | 0 refills | Status: AC | PRN

## 2019-04-27 MED ORDER — FENTANYL CITRATE (PF) 100 MCG/2ML IJ SOLN
INTRAMUSCULAR | Status: AC
  Administered 2019-04-27: 14:00:00 50 ug via INTRAVENOUS
  Filled 2019-04-27: qty 2

## 2019-04-27 MED ORDER — MIDAZOLAM HCL 2 MG/2ML IJ SOLN
2.0000 mg | Freq: Once | INTRAMUSCULAR | Status: AC
  Administered 2019-04-27: 14:00:00 2 mg via INTRAVENOUS

## 2019-04-27 MED ORDER — IBUPROFEN 600 MG PO TABS: 600.0000 mg | ORAL_TABLET | Freq: Four times a day (QID) | ORAL | 0 refills | Status: AC | PRN

## 2019-04-27 MED ORDER — LIDOCAINE 2% (20 MG/ML) 5 ML SYRINGE
INTRAMUSCULAR | Status: DC | PRN
  Administered 2019-04-27: 100 mg via INTRAVENOUS

## 2019-04-27 MED ORDER — 0.9 % SODIUM CHLORIDE (POUR BTL) OPTIME
TOPICAL | Status: DC | PRN
  Administered 2019-04-27: 16:00:00 1000 mL

## 2019-04-27 MED ORDER — FENTANYL CITRATE (PF) 100 MCG/2ML IJ SOLN
INTRAMUSCULAR | Status: AC
  Administered 2019-04-27: 25 ug via INTRAVENOUS
  Filled 2019-04-27: qty 2

## 2019-04-27 MED ORDER — DEXAMETHASONE SODIUM PHOSPHATE 10 MG/ML IJ SOLN
INTRAMUSCULAR | Status: DC | PRN
  Administered 2019-04-27: 4 mg via INTRAVENOUS

## 2019-04-27 MED ORDER — CHLORHEXIDINE GLUCONATE CLOTH 2 % EX PADS: 6.0000 | MEDICATED_PAD | Freq: Once | CUTANEOUS | Status: DC

## 2019-04-27 MED ORDER — MIDAZOLAM HCL 2 MG/2ML IJ SOLN
INTRAMUSCULAR | Status: AC
  Administered 2019-04-27: 14:00:00 2 mg via INTRAVENOUS
  Filled 2019-04-27: qty 2

## 2019-04-27 MED ORDER — ACETAMINOPHEN 500 MG PO TABS
1000.0000 mg | ORAL_TABLET | ORAL | Status: AC
  Administered 2019-04-27: 14:00:00 1000 mg via ORAL

## 2019-04-27 MED ORDER — TECHNETIUM TC 99M SULFUR COLLOID FILTERED
1.0000 | Freq: Once | INTRAVENOUS | Status: AC | PRN
  Administered 2019-04-27: 1 via INTRADERMAL

## 2019-04-27 MED ORDER — LIDOCAINE HCL (PF) 1 % IJ SOLN
INTRAMUSCULAR | Status: AC
  Filled 2019-04-27: qty 30

## 2019-04-27 MED ORDER — PROPOFOL 10 MG/ML IV BOLUS
INTRAVENOUS | Status: DC | PRN
  Administered 2019-04-27: 150 mg via INTRAVENOUS

## 2019-04-27 MED ORDER — PHENYLEPHRINE HCL (PRESSORS) 10 MG/ML IV SOLN
INTRAVENOUS | Status: DC | PRN
  Administered 2019-04-27: 120 ug via INTRAVENOUS

## 2019-04-27 MED ORDER — ACETAMINOPHEN 325 MG PO TABS: 325.0000 mg | ORAL_TABLET | ORAL | Status: DC | PRN

## 2019-04-27 MED ORDER — ONDANSETRON HCL 4 MG/2ML IJ SOLN
INTRAMUSCULAR | Status: DC | PRN
  Administered 2019-04-27: 4 mg via INTRAVENOUS

## 2019-04-27 MED ORDER — SODIUM CHLORIDE (PF) 0.9 % IJ SOLN
INTRAVENOUS | Status: DC | PRN
  Administered 2019-04-27: 17:00:00 5 mL via SUBCUTANEOUS

## 2019-04-27 MED ORDER — BUPIVACAINE-EPINEPHRINE (PF) 0.5% -1:200000 IJ SOLN
INTRAMUSCULAR | Status: DC | PRN
  Administered 2019-04-27: 30 mL via PERINEURAL

## 2019-04-27 MED ORDER — ONDANSETRON HCL 4 MG/2ML IJ SOLN: 4.0000 mg | Freq: Once | INTRAMUSCULAR | Status: DC | PRN

## 2019-04-27 MED ORDER — MEPERIDINE HCL 25 MG/ML IJ SOLN: 6.2500 mg | INTRAMUSCULAR | Status: DC | PRN

## 2019-04-27 MED ORDER — HEPARIN SOD (PORK) LOCK FLUSH 100 UNIT/ML IV SOLN
INTRAVENOUS | Status: DC | PRN
  Administered 2019-04-27: 500 [IU]

## 2019-04-27 MED ORDER — SODIUM CHLORIDE 0.9 % IV SOLN
INTRAVENOUS | Status: AC
  Filled 2019-04-27: qty 1.2

## 2019-04-27 MED ORDER — FENTANYL CITRATE (PF) 100 MCG/2ML IJ SOLN
INTRAMUSCULAR | Status: DC | PRN
  Administered 2019-04-27 (×3): 50 ug via INTRAVENOUS

## 2019-04-27 MED ORDER — CEFAZOLIN SODIUM-DEXTROSE 2-4 GM/100ML-% IV SOLN
2.0000 g | INTRAVENOUS | Status: AC
  Administered 2019-04-27: 2 g via INTRAVENOUS

## 2019-04-27 MED ORDER — FENTANYL CITRATE (PF) 100 MCG/2ML IJ SOLN
25.0000 ug | INTRAMUSCULAR | Status: DC | PRN
  Administered 2019-04-27 (×2): 25 ug via INTRAVENOUS

## 2019-04-27 MED ORDER — BUPIVACAINE LIPOSOME 1.3 % IJ SUSP
INTRAMUSCULAR | Status: DC | PRN
  Administered 2019-04-27: 10 mL via PERINEURAL

## 2019-04-27 MED ORDER — LACTATED RINGERS IV SOLN
INTRAVENOUS | Status: DC
  Administered 2019-04-27: 15:00:00 via INTRAVENOUS

## 2019-04-27 MED ORDER — METHYLENE BLUE 0.5 % INJ SOLN
INTRAVENOUS | Status: AC
  Filled 2019-04-27: qty 10

## 2019-04-27 MED ORDER — HEPARIN SOD (PORK) LOCK FLUSH 100 UNIT/ML IV SOLN
INTRAVENOUS | Status: AC
  Filled 2019-04-27: qty 5

## 2019-04-27 MED ORDER — GABAPENTIN 300 MG PO CAPS
ORAL_CAPSULE | ORAL | Status: AC
  Administered 2019-04-27: 300 mg via ORAL
  Filled 2019-04-27: qty 1

## 2019-04-27 MED ORDER — OXYCODONE HCL 5 MG PO TABS
5.0000 mg | ORAL_TABLET | Freq: Once | ORAL | Status: AC | PRN
  Administered 2019-04-27: 18:00:00 5 mg via ORAL

## 2019-04-27 MED ORDER — ACETAMINOPHEN 500 MG PO TABS
ORAL_TABLET | ORAL | Status: AC
  Administered 2019-04-27: 1000 mg via ORAL
  Filled 2019-04-27: qty 2

## 2019-04-27 MED ORDER — FENTANYL CITRATE (PF) 100 MCG/2ML IJ SOLN
50.0000 ug | Freq: Once | INTRAMUSCULAR | Status: AC
  Administered 2019-04-27: 14:00:00 50 ug via INTRAVENOUS

## 2019-04-27 MED ORDER — SODIUM CHLORIDE 0.9 % IV SOLN
INTRAVENOUS | Status: DC | PRN
  Administered 2019-04-27: 200 mL

## 2019-04-27 MED ORDER — OXYCODONE HCL 5 MG PO TABS
ORAL_TABLET | ORAL | Status: AC
  Filled 2019-04-27: qty 1

## 2019-04-27 MED ORDER — ACETAMINOPHEN 160 MG/5ML PO SOLN: 325.0000 mg | ORAL | Status: DC | PRN

## 2019-04-27 MED ORDER — LIDOCAINE HCL 1 % IJ SOLN
INTRAMUSCULAR | Status: DC | PRN
  Administered 2019-04-27: 10 mL
  Administered 2019-04-27: 30 mL

## 2019-04-27 SURGICAL SUPPLY — 69 items
BAG DECANTER FOR FLEXI CONT (MISCELLANEOUS) ×4 IMPLANT
BINDER BREAST LRG (GAUZE/BANDAGES/DRESSINGS) IMPLANT
BINDER BREAST XLRG (GAUZE/BANDAGES/DRESSINGS) ×4 IMPLANT
BNDG COHESIVE 4X5 TAN STRL (GAUZE/BANDAGES/DRESSINGS) ×4 IMPLANT
CANISTER SUCT 3000ML PPV (MISCELLANEOUS) ×4 IMPLANT
CHLORAPREP W/TINT 26 (MISCELLANEOUS) ×4 IMPLANT
CLIP VESOCCLUDE LG 6/CT (CLIP) ×4 IMPLANT
CLIP VESOCCLUDE MED 6/CT (CLIP) ×4 IMPLANT
CLIP VESOCCLUDE SM WIDE 6/CT (CLIP) ×4 IMPLANT
CLOSURE WOUND 1/2 X4 (GAUZE/BANDAGES/DRESSINGS) ×1
CONT SPEC 4OZ CLIKSEAL STRL BL (MISCELLANEOUS) ×4 IMPLANT
COVER PROBE W GEL 5X96 (DRAPES) ×4 IMPLANT
COVER SURGICAL LIGHT HANDLE (MISCELLANEOUS) ×8 IMPLANT
COVER TRANSDUCER ULTRASND GEL (DRAPE) IMPLANT
COVER WAND RF STERILE (DRAPES) IMPLANT
DECANTER SPIKE VIAL GLASS SM (MISCELLANEOUS) ×8 IMPLANT
DERMABOND ADVANCED (GAUZE/BANDAGES/DRESSINGS) ×2
DERMABOND ADVANCED .7 DNX12 (GAUZE/BANDAGES/DRESSINGS) ×2 IMPLANT
DEVICE DUBIN SPECIMEN MAMMOGRA (MISCELLANEOUS) ×4 IMPLANT
DRAPE C-ARM 42X72 X-RAY (DRAPES) ×4 IMPLANT
DRAPE CHEST BREAST 15X10 FENES (DRAPES) IMPLANT
DRAPE WARM FLUID 44X44 (DRAPES) IMPLANT
ELECT BLADE 4.0 EZ CLEAN MEGAD (MISCELLANEOUS) ×4
ELECT COATED BLADE 2.86 ST (ELECTRODE) ×4 IMPLANT
ELECT NEEDLE BLADE 2-5/6 (NEEDLE) IMPLANT
ELECT REM PT RETURN 9FT ADLT (ELECTROSURGICAL) ×4
ELECTRODE BLDE 4.0 EZ CLN MEGD (MISCELLANEOUS) ×2 IMPLANT
ELECTRODE REM PT RTRN 9FT ADLT (ELECTROSURGICAL) ×2 IMPLANT
GAUZE 4X4 16PLY RFD (DISPOSABLE) ×4 IMPLANT
GEL ULTRASOUND 20GR AQUASONIC (MISCELLANEOUS) IMPLANT
GLOVE BIO SURGEON STRL SZ 6 (GLOVE) ×8 IMPLANT
GLOVE INDICATOR 6.5 STRL GRN (GLOVE) ×8 IMPLANT
GOWN STRL REUS W/ TWL LRG LVL3 (GOWN DISPOSABLE) ×6 IMPLANT
GOWN STRL REUS W/TWL 2XL LVL3 (GOWN DISPOSABLE) ×8 IMPLANT
GOWN STRL REUS W/TWL LRG LVL3 (GOWN DISPOSABLE) ×6
KIT BASIN OR (CUSTOM PROCEDURE TRAY) ×4 IMPLANT
KIT MARKER MARGIN INK (KITS) ×4 IMPLANT
KIT PORT POWER 8FR ISP CVUE (Port) ×4 IMPLANT
KIT TURNOVER KIT B (KITS) ×4 IMPLANT
LIGHT WAVEGUIDE WIDE FLAT (MISCELLANEOUS) ×4 IMPLANT
NEEDLE 18GX1X1/2 (RX/OR ONLY) (NEEDLE) ×4 IMPLANT
NEEDLE 22X1 1/2 (OR ONLY) (NEEDLE) ×4 IMPLANT
NEEDLE FILTER BLUNT 18X 1/2SAF (NEEDLE) ×2
NEEDLE FILTER BLUNT 18X1 1/2 (NEEDLE) ×2 IMPLANT
NEEDLE HYPO 25GX1X1/2 BEV (NEEDLE) ×4 IMPLANT
NS IRRIG 1000ML POUR BTL (IV SOLUTION) ×4 IMPLANT
PACK GENERAL/GYN (CUSTOM PROCEDURE TRAY) ×4 IMPLANT
PACK UNIVERSAL I (CUSTOM PROCEDURE TRAY) ×4 IMPLANT
PAD ARMBOARD 7.5X6 YLW CONV (MISCELLANEOUS) ×4 IMPLANT
PENCIL BUTTON HOLSTER BLD 10FT (ELECTRODE) IMPLANT
POSITIONER HEAD DONUT 9IN (MISCELLANEOUS) ×4 IMPLANT
STOCKINETTE IMPERVIOUS 9X36 MD (GAUZE/BANDAGES/DRESSINGS) ×4 IMPLANT
STRIP CLOSURE SKIN 1/2X4 (GAUZE/BANDAGES/DRESSINGS) ×3 IMPLANT
SUT MNCRL AB 4-0 PS2 18 (SUTURE) ×8 IMPLANT
SUT MON AB 4-0 PC3 18 (SUTURE) ×4 IMPLANT
SUT PROLENE 2 0 SH DA (SUTURE) ×8 IMPLANT
SUT VIC AB 2-0 SH 27 (SUTURE) ×2
SUT VIC AB 2-0 SH 27XBRD (SUTURE) ×2 IMPLANT
SUT VIC AB 3-0 SH 27 (SUTURE) ×2
SUT VIC AB 3-0 SH 27X BRD (SUTURE) ×2 IMPLANT
SUT VIC AB 3-0 SH 8-18 (SUTURE) ×4 IMPLANT
SYR 5ML LUER SLIP (SYRINGE) ×4 IMPLANT
SYR CONTROL 10ML LL (SYRINGE) ×8 IMPLANT
TOWEL GREEN STERILE (TOWEL DISPOSABLE) ×4 IMPLANT
TOWEL GREEN STERILE FF (TOWEL DISPOSABLE) ×4 IMPLANT
TRAY LAPAROSCOPIC MC (CUSTOM PROCEDURE TRAY) IMPLANT
TUBE CONNECTING 12'X1/4 (SUCTIONS)
TUBE CONNECTING 12X1/4 (SUCTIONS) IMPLANT
YANKAUER SUCT BULB TIP NO VENT (SUCTIONS) IMPLANT

## 2019-04-27 NOTE — Anesthesia Procedure Notes (Signed)
Anesthesia Regional Block: Pectoralis block   Pre-Anesthetic Checklist: ,, timeout performed, Correct Patient, Correct Site, Correct Laterality, Correct Procedure, Correct Position, site marked, Risks and benefits discussed,  Surgical consent,  Pre-op evaluation,  At surgeon's request and post-op pain management  Laterality: Left  Prep: chloraprep       Needles:  Injection technique: Single-shot  Needle Type: Echogenic Stimulator Needle     Needle Length: 5cm  Needle Gauge: 22     Additional Needles:   Procedures:, nerve stimulator,,, ultrasound used (permanent image in chart),,,,  Narrative:  Start time: 04/27/2019 2:33 PM End time: 04/27/2019 2:35 PM Injection made incrementally with aspirations every 5 mL.  Performed by: Personally  Anesthesiologist: Janeece Riggers, MD  Additional Notes: Functioning IV was confirmed and monitors were applied.  A 59mm 22ga Arrow echogenic stimulator needle was used. Sterile prep and drape,hand hygiene and sterile gloves were used. Ultrasound guidance: relevant anatomy identified, needle position confirmed, local anesthetic spread visualized around nerve(s)., vascular puncture avoided.  Image printed for medical record. Negative aspiration and negative test dose prior to incremental administration of local anesthetic. The patient tolerated the procedure well.

## 2019-04-27 NOTE — H&P (Signed)
Laura Fisher Documented: 04/19/2019 7:27 AM Location: [facility] Patient #: [id number] DOB: 1955-12-24 Undefined / Language: Laura Fisher / Race: White Female   History of Present Illness Laura Klein MD; 04/19/2019 11:39 AM) The patient is a 63 year old female who presents with breast cancer.Pt is a 63 yo F who is referred for consultation by Dr. Dario Guardian for a new diagnosis of left breast cancer. She had a 4 mm screening detected mass at 11:30 in June 2020 that was BIRADS3 and 3 month ultrasound was recommended. Repeat ultrasound in September showed increase size (7 mm) and this was upgraded to BIRADS 4. Original imaging was done in [place], but biopsy was performed in [place]. 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 The patient is understandably quite anxious about this.   From a family history standpoint she had a nephew with pancreatic cancer, stomach cancer in her maternal aunt, tongue cancer in her mother, leukemia in her paternal aunt, and another paternal aunt with stomach cancer. She had menarche at age 23. She is a G2P2 with first child at age 44. She has had a small number of colon polyps. She is a Financial trader.    pathology 04/13/2019 Diagnosis Breast, left, needle core biopsy, 11:30 o'clock, 8cmfn - INVASIVE DUCTAL CARCINOMA - SEE COMMENT Microscopic Comment Based on the biopsy, the carcinoma appears Nottingham grade 3 of 3 and measures 0.9 cm in greatest linear extent. Estrogen Receptor: 0%, NEGATIVE Progesterone Receptor: 0%, NEGATIVE Proliferation Marker Ki67: 40% GROUP 5: HER2 **NEGATIVE**   Past Surgical History Tawni Pummel, RN; 04/19/2019 7:27 AM) Tonsillectomy   Diagnostic Studies History Tawni Pummel, RN; 04/19/2019 7:27 AM) Colonoscopy  1-5 years ago Mammogram  within last year Pap Smear  1-5 years ago  Medication History Tawni Pummel, RN;  04/19/2019 7:28 AM) Medications Reconciled  Social History Tawni Pummel, RN; 04/19/2019 7:27 AM) Caffeine use  Coffee. No alcohol use  No drug use  Tobacco use  Never smoker.  Family History Tawni Pummel, RN; 04/19/2019 7:27 AM) Arthritis  Mother. Cancer  Mother. Cerebrovascular Accident  Mother. Depression  Brother. Diabetes Mellitus  Sister. Heart Disease  Mother. Hypertension  Father.  Pregnancy / Birth History Tawni Pummel, RN; 04/19/2019 7:27 AM) Age of menopause  68-55 Gravida  3 Length (months) of breastfeeding  7-12 Maternal age  68-35 Para  2  Other Problems Tawni Pummel, RN; 04/19/2019 7:27 AM) Arthritis  Gastroesophageal Reflux Disease  Heart murmur  High blood pressure  Sleep Apnea     Review of Systems Sunday Spillers Ledford RN; 04/19/2019 7:27 AM) General Present- Appetite Loss. Not Present- Chills, Fatigue, Fever, Night Sweats, Weight Gain and Weight Loss. Skin Present- Rash. Not Present- Change in Wart/Mole, Dryness, Hives, Jaundice, New Lesions, Non-Healing Wounds and Ulcer. HEENT Present- Wears glasses/contact lenses. Not Present- Earache, Hearing Loss, Hoarseness, Nose Bleed, Oral Ulcers, Ringing in the Ears, Seasonal Allergies, Sinus Pain, Sore Throat, Visual Disturbances and Yellow Eyes. Respiratory Not Present- Bloody sputum, Chronic Cough, Difficulty Breathing, Snoring and Wheezing. Breast Not Present- Breast Mass, Breast Pain, Nipple Discharge and Skin Changes. Cardiovascular Present- Rapid Heart Rate. Not Present- Chest Pain, Difficulty Breathing Lying Down, Leg Cramps, Palpitations, Shortness of Breath and Swelling of Extremities. Gastrointestinal Not Present- Abdominal Pain, Bloating, Bloody Stool, Change in Bowel Habits, Chronic diarrhea, Constipation, Difficulty Swallowing, Excessive gas, Gets full quickly at meals, Hemorrhoids, Indigestion, Nausea, Rectal Pain and Vomiting. Female Genitourinary Not Present- Frequency,  Nocturia,  Painful Urination, Pelvic Pain and Urgency. Musculoskeletal Present- Joint Pain. Not Present- Back Pain, Joint Stiffness, Muscle Pain, Muscle Weakness and Swelling of Extremities. Neurological Not Present- Decreased Memory, Fainting, Headaches, Numbness, Seizures, Tingling, Tremor, Trouble walking and Weakness. Psychiatric Not Present- Anxiety, Bipolar, Change in Sleep Pattern, Depression, Fearful and Frequent crying. Endocrine Not Present- Cold Intolerance, Excessive Hunger, Hair Changes, Heat Intolerance, Hot flashes and New Diabetes. Hematology Not Present- Blood Thinners, Easy Bruising, Excessive bleeding, Gland problems, HIV and Persistent Infections.  Vitals Laura Klein MD; 04/19/2019 11:39 AM) 04/19/2019 11:38 AM Weight: 191.8 lb Height: 65in Body Surface Area: 1.94 m Body Mass Index: 31.92 kg/m  Temp.: 97.21F  Pulse: 79 (Regular)  Resp.: 18 (Unlabored)  BP: 164/93(Sitting, Left Arm, Standard)       Physical Exam Laura Klein MD; 04/19/2019 11:04 AM) General Mental Status-Alert. General Appearance-Consistent with stated age. Hydration-Well hydrated. Voice-Normal.  Head and Neck Head-normocephalic, atraumatic with no lesions or palpable masses. Trachea-midline. Thyroid Gland Characteristics - normal size and consistency.  Eye Eyeball - Bilateral-Extraocular movements intact. Sclera/Conjunctiva - Bilateral-No scleral icterus.  Chest and Lung Exam Chest and lung exam reveals -quiet, even and easy respiratory effort with no use of accessory muscles and on auscultation, normal breath sounds, no adventitious sounds and normal vocal resonance. Inspection Chest Wall - Normal. Back - normal.  Breast Note: bruising on left upper central breast. no palpable masses. no skin dimpling. no LAD. no nipple retraction of skin dimpling. Right breast normal.   Cardiovascular Cardiovascular examination reveals -normal heart sounds,  regular rate and rhythm with no murmurs and normal pedal pulses bilaterally.  Abdomen Inspection Inspection of the abdomen reveals - No Hernias. Palpation/Percussion Palpation and Percussion of the abdomen reveal - Soft, Non Tender, No Rebound tenderness, No Rigidity (guarding) and No hepatosplenomegaly. Auscultation Auscultation of the abdomen reveals - Bowel sounds normal.  Neurologic Neurologic evaluation reveals -alert and oriented x 3 with no impairment of recent or remote memory. Mental Status-Normal.  Musculoskeletal Global Assessment -Note: no gross deformities.  Normal Exam - Left-Upper Extremity Strength Normal and Lower Extremity Strength Normal. Normal Exam - Right-Upper Extremity Strength Normal and Lower Extremity Strength Normal.  Lymphatic Head & Neck  General Head & Neck Lymphatics: Bilateral - Description - Normal. Axillary  General Axillary Region: Bilateral - Description - Normal. Tenderness - Non Tender. Femoral & Inguinal  Generalized Femoral & Inguinal Lymphatics: Bilateral - Description - No Generalized lymphadenopathy.    Assessment & Plan Laura Klein MD; 04/19/2019 11:38 AM)  MALIGNANT NEOPLASM OF UPPER-INNER QUADRANT OF LEFT BREAST IN FEMALE, ESTROGEN RECEPTOR NEGATIVE (C50.212) Impression: Patient has a new diagnosis of a cT1bN0 left breast cancer that is triple negative. We will plan a left seed localized lumpectomy wtih sentinel lymph node biopsy and port placement. This will be followed by chemo and radiation.  The surgical procedure was described to the patient. I discussed the incision type and location and that we would need radiology involved on with a wire or seed marker and/or sentinel node.  The risks and benefits of the procedure were described to the patient and she wishes to proceed.  We discussed the risks bleeding, infection, damage to other structures, need for further procedures/surgeries. We discussed the risk of  seroma. The patient was advised if the area in the breast in cancer, we may need to go back to surgery for additional tissue to obtain negative margins or for a lymph node biopsy. The patient was advised that these are the most common  complications, but that others can occur as well. They were advised against taking aspirin or other anti-inflammatory agents/blood thinners the week before surgery.  63 min spent in evaluation, examination, counseling, and coordination of care. >50% spent in counseling.   FAMILY HISTORY OF CANCER (Z80.9) Impression: Patient does not have a typical familial pattern of cancer, but there are enough potentially related cancers that she warrants genetic testing.    Signed by Laura Klein, MD (04/19/2019 11:40 AM)

## 2019-04-27 NOTE — Telephone Encounter (Signed)
Records faxed to [facility] - Release [id number]

## 2019-04-27 NOTE — Interval H&P Note (Signed)
History and Physical Interval Note:  04/27/2019 2:32 PM  Laura Fisher  has presented today for surgery, with the diagnosis of LEFT BREAST CANCER.  The various methods of treatment have been discussed with the patient and family. After consideration of risks, benefits and other options for treatment, the patient has consented to  Procedure(s): LEFT BREAST LUMPECTOMY WITH RADIOACTIVE SEED AND SENTINEL LYMPH NODE BIOPSY (Left) INSERTION PORT-A-CATH WITH possible ULTRASOUND (N/A) as a surgical intervention.  We originally were going to wait for final pathology to place port, but given amount of cancer on core needle biopsy, will need chemotherapy.  The patient's history has been reviewed, patient examined, no change in status, stable for surgery.  I have reviewed the patient's chart and labs.  Questions were answered to the patient's satisfaction.     Stark Klein

## 2019-04-27 NOTE — H&P (View-Only) (Signed)
Laura Fisher Documented: 04/19/2019 7:27 AM Location: [facility] Patient #: [id number] DOB: 1955-12-24 Undefined / Language: Laura Fisher / Race: White Female   History of Present Illness Stark Klein MD; 04/19/2019 11:39 AM) The patient is a 63 year old female who presents with breast cancer.Pt is a 63 yo F who is referred for consultation by Dr. Dario Guardian for a new diagnosis of left breast cancer. She had a 4 mm screening detected mass at 11:30 in June 2020 that was BIRADS3 and 3 month ultrasound was recommended. Repeat ultrasound in September showed increase size (7 mm) and this was upgraded to BIRADS 4. Original imaging was done in [place], but biopsy was performed in [place]. 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 The patient is understandably quite anxious about this.   From a family history standpoint she had a nephew with pancreatic cancer, stomach cancer in her maternal aunt, tongue cancer in her mother, leukemia in her paternal aunt, and another paternal aunt with stomach cancer. She had menarche at age 23. She is a G2P2 with first child at age 44. She has had a small number of colon polyps. She is a Financial trader.    pathology 04/13/2019 Diagnosis Breast, left, needle core biopsy, 11:30 o'clock, 8cmfn - INVASIVE DUCTAL CARCINOMA - SEE COMMENT Microscopic Comment Based on the biopsy, the carcinoma appears Nottingham grade 3 of 3 and measures 0.9 cm in greatest linear extent. Estrogen Receptor: 0%, NEGATIVE Progesterone Receptor: 0%, NEGATIVE Proliferation Marker Ki67: 40% GROUP 5: HER2 **NEGATIVE**   Past Surgical History Tawni Pummel, RN; 04/19/2019 7:27 AM) Tonsillectomy   Diagnostic Studies History Tawni Pummel, RN; 04/19/2019 7:27 AM) Colonoscopy  1-5 years ago Mammogram  within last year Pap Smear  1-5 years ago  Medication History Tawni Pummel, RN;  04/19/2019 7:28 AM) Medications Reconciled  Social History Tawni Pummel, RN; 04/19/2019 7:27 AM) Caffeine use  Coffee. No alcohol use  No drug use  Tobacco use  Never smoker.  Family History Tawni Pummel, RN; 04/19/2019 7:27 AM) Arthritis  Mother. Cancer  Mother. Cerebrovascular Accident  Mother. Depression  Brother. Diabetes Mellitus  Sister. Heart Disease  Mother. Hypertension  Father.  Pregnancy / Birth History Tawni Pummel, RN; 04/19/2019 7:27 AM) Age of menopause  68-55 Gravida  3 Length (months) of breastfeeding  7-12 Maternal age  68-35 Para  2  Other Problems Tawni Pummel, RN; 04/19/2019 7:27 AM) Arthritis  Gastroesophageal Reflux Disease  Heart murmur  High blood pressure  Sleep Apnea     Review of Systems Sunday Spillers Ledford RN; 04/19/2019 7:27 AM) General Present- Appetite Loss. Not Present- Chills, Fatigue, Fever, Night Sweats, Weight Gain and Weight Loss. Skin Present- Rash. Not Present- Change in Wart/Mole, Dryness, Hives, Jaundice, New Lesions, Non-Healing Wounds and Ulcer. HEENT Present- Wears glasses/contact lenses. Not Present- Earache, Hearing Loss, Hoarseness, Nose Bleed, Oral Ulcers, Ringing in the Ears, Seasonal Allergies, Sinus Pain, Sore Throat, Visual Disturbances and Yellow Eyes. Respiratory Not Present- Bloody sputum, Chronic Cough, Difficulty Breathing, Snoring and Wheezing. Breast Not Present- Breast Mass, Breast Pain, Nipple Discharge and Skin Changes. Cardiovascular Present- Rapid Heart Rate. Not Present- Chest Pain, Difficulty Breathing Lying Down, Leg Cramps, Palpitations, Shortness of Breath and Swelling of Extremities. Gastrointestinal Not Present- Abdominal Pain, Bloating, Bloody Stool, Change in Bowel Habits, Chronic diarrhea, Constipation, Difficulty Swallowing, Excessive gas, Gets full quickly at meals, Hemorrhoids, Indigestion, Nausea, Rectal Pain and Vomiting. Female Genitourinary Not Present- Frequency,  Nocturia,  Painful Urination, Pelvic Pain and Urgency. Musculoskeletal Present- Joint Pain. Not Present- Back Pain, Joint Stiffness, Muscle Pain, Muscle Weakness and Swelling of Extremities. Neurological Not Present- Decreased Memory, Fainting, Headaches, Numbness, Seizures, Tingling, Tremor, Trouble walking and Weakness. Psychiatric Not Present- Anxiety, Bipolar, Change in Sleep Pattern, Depression, Fearful and Frequent crying. Endocrine Not Present- Cold Intolerance, Excessive Hunger, Hair Changes, Heat Intolerance, Hot flashes and New Diabetes. Hematology Not Present- Blood Thinners, Easy Bruising, Excessive bleeding, Gland problems, HIV and Persistent Infections.  Vitals Stark Klein MD; 04/19/2019 11:39 AM) 04/19/2019 11:38 AM Weight: 191.8 lb Height: 65in Body Surface Area: 1.94 m Body Mass Index: 31.92 kg/m  Temp.: 97.21F  Pulse: 79 (Regular)  Resp.: 18 (Unlabored)  BP: 164/93(Sitting, Left Arm, Standard)       Physical Exam Stark Klein MD; 04/19/2019 11:04 AM) General Mental Status-Alert. General Appearance-Consistent with stated age. Hydration-Well hydrated. Voice-Normal.  Head and Neck Head-normocephalic, atraumatic with no lesions or palpable masses. Trachea-midline. Thyroid Gland Characteristics - normal size and consistency.  Eye Eyeball - Bilateral-Extraocular movements intact. Sclera/Conjunctiva - Bilateral-No scleral icterus.  Chest and Lung Exam Chest and lung exam reveals -quiet, even and easy respiratory effort with no use of accessory muscles and on auscultation, normal breath sounds, no adventitious sounds and normal vocal resonance. Inspection Chest Wall - Normal. Back - normal.  Breast Note: bruising on left upper central breast. no palpable masses. no skin dimpling. no LAD. no nipple retraction of skin dimpling. Right breast normal.   Cardiovascular Cardiovascular examination reveals -normal heart sounds,  regular rate and rhythm with no murmurs and normal pedal pulses bilaterally.  Abdomen Inspection Inspection of the abdomen reveals - No Hernias. Palpation/Percussion Palpation and Percussion of the abdomen reveal - Soft, Non Tender, No Rebound tenderness, No Rigidity (guarding) and No hepatosplenomegaly. Auscultation Auscultation of the abdomen reveals - Bowel sounds normal.  Neurologic Neurologic evaluation reveals -alert and oriented x 3 with no impairment of recent or remote memory. Mental Status-Normal.  Musculoskeletal Global Assessment -Note: no gross deformities.  Normal Exam - Left-Upper Extremity Strength Normal and Lower Extremity Strength Normal. Normal Exam - Right-Upper Extremity Strength Normal and Lower Extremity Strength Normal.  Lymphatic Head & Neck  General Head & Neck Lymphatics: Bilateral - Description - Normal. Axillary  General Axillary Region: Bilateral - Description - Normal. Tenderness - Non Tender. Femoral & Inguinal  Generalized Femoral & Inguinal Lymphatics: Bilateral - Description - No Generalized lymphadenopathy.    Assessment & Plan Stark Klein MD; 04/19/2019 11:38 AM)  MALIGNANT NEOPLASM OF UPPER-INNER QUADRANT OF LEFT BREAST IN FEMALE, ESTROGEN RECEPTOR NEGATIVE (C50.212) Impression: Patient has a new diagnosis of a cT1bN0 left breast cancer that is triple negative. We will plan a left seed localized lumpectomy wtih sentinel lymph node biopsy and port placement. This will be followed by chemo and radiation.  The surgical procedure was described to the patient. I discussed the incision type and location and that we would need radiology involved on with a wire or seed marker and/or sentinel node.  The risks and benefits of the procedure were described to the patient and she wishes to proceed.  We discussed the risks bleeding, infection, damage to other structures, need for further procedures/surgeries. We discussed the risk of  seroma. The patient was advised if the area in the breast in cancer, we may need to go back to surgery for additional tissue to obtain negative margins or for a lymph node biopsy. The patient was advised that these are the most common  complications, but that others can occur as well. They were advised against taking aspirin or other anti-inflammatory agents/blood thinners the week before surgery.  63 min spent in evaluation, examination, counseling, and coordination of care. >50% spent in counseling.   FAMILY HISTORY OF CANCER (Z80.9) Impression: Patient does not have a typical familial pattern of cancer, but there are enough potentially related cancers that she warrants genetic testing.    Signed by Stark Klein, MD (04/19/2019 11:40 AM)

## 2019-04-27 NOTE — Discharge Instructions (Addendum)
[facility] °Office Phone Number [phone] ° °BREAST BIOPSY/ PARTIAL MASTECTOMY: POST OP INSTRUCTIONS ° °Always review your discharge instruction sheet given to you by the facility where your surgery was performed. ° °IF YOU HAVE DISABILITY OR FAMILY LEAVE FORMS, YOU MUST BRING THEM TO THE OFFICE FOR PROCESSING.  DO NOT GIVE THEM TO YOUR DOCTOR. ° °1. A prescription for pain medication may be given to you upon discharge.  Take your pain medication as prescribed, if needed.  If narcotic pain medicine is not needed, then you may take acetaminophen (Tylenol) or ibuprofen (Advil) as needed. °2. Take your usually prescribed medications unless otherwise directed °3. If you need a refill on your pain medication, please contact your pharmacy.  They will contact our office to request authorization.  Prescriptions will not be filled after 5pm or on week-ends. °4. You should eat very light the first 24 hours after surgery, such as soup, crackers, pudding, etc.  Resume your normal diet the day after surgery. °5. Most patients will experience some swelling and bruising in the breast.  Ice packs and a good support bra will help.  Swelling and bruising can take several days to resolve.  °6. It is common to experience some constipation if taking pain medication after surgery.  Increasing fluid intake and taking a stool softener will usually help or prevent this problem from occurring.  A mild laxative (Milk of Magnesia or Miralax) should be taken according to package directions if there are no bowel movements after 48 hours. °7. Unless discharge instructions indicate otherwise, you may remove your bandages 48 hours after surgery, and you may shower at that time.  You may have steri-strips (small skin tapes) in place directly over the incision.  These strips should be left on the skin for 7-10 days.   Any sutures or staples will be removed at the office during your follow-up visit. °8. ACTIVITIES:  You may resume  regular daily activities (gradually increasing) beginning the next day.  Wearing a good support bra or sports bra (or the breast binder) minimizes pain and swelling.  You may have sexual intercourse when it is comfortable. °a. You may drive when you no longer are taking prescription pain medication, you can comfortably wear a seatbelt, and you can safely maneuver your car and apply brakes. °b. RETURN TO WORK:  __________1 week_______________ °9. You should see your doctor in the office for a follow-up appointment approximately two weeks after your surgery.  Your doctor’s nurse will typically make your follow-up appointment when she calls you with your pathology report.  Expect your pathology report 2-3 business days after your surgery.  You may call to check if you do not hear from us after three days. ° ° °WHEN TO CALL YOUR DOCTOR: °1. Fever over 101.0 °2. Nausea and/or vomiting. °3. Extreme swelling or bruising. °4. Continued bleeding from incision. °5. Increased pain, redness, or drainage from the incision. ° °The clinic staff is available to answer your questions during regular business hours.  Please don’t hesitate to call and ask to speak to one of the nurses for clinical concerns.  If you have a medical emergency, go to the nearest emergency room or call 911.  A surgeon from [facility] is always on call at the hospital. ° °For further questions, please visit [email or web address]  ° °

## 2019-04-27 NOTE — Transfer of Care (Signed)
Immediate Anesthesia Transfer of Care Note  Patient: [name] Doctor, general practice  Procedure(s) Performed: LEFT BREAST LUMPECTOMY WITH RADIOACTIVE SEED AND SENTINEL LYMPH NODE BIOPSY (Left Breast) INSERTION PORT-A-CATH WITH ULTRASOUND (Right Chest)  Patient Location: PACU  Anesthesia Type:GA combined with regional for post-op pain  Level of Consciousness: drowsy and patient cooperative  Airway & Oxygen Therapy: Patient Spontanous Breathing and Patient connected to nasal cannula oxygen  Post-op Assessment: Report given to RN, Post -op Vital signs reviewed and stable and Patient moving all extremities  Post vital signs: Reviewed and stable  Last Vitals:  Vitals Value Taken Time  BP    Temp    Pulse 78 04/27/19 1722  Resp 7 04/27/19 1722  SpO2 100 % 04/27/19 1722  Vitals shown include unvalidated device data.  Last Pain:  Vitals:   04/27/19 1430  TempSrc:   PainSc: 0-No pain      Patients Stated Pain Goal: 3 ([id number])  Complications: No apparent anesthesia complications

## 2019-04-27 NOTE — Telephone Encounter (Signed)
Spoke to pt concerning Tasley from 9.23.20. Denies questions or concerns regarding dx or treatment care plan. Pt will have sx today. Scheduled and confirmed post op with Dr. Lindi Adie on 10/12 at 11:45. Encourage pt to call with needs. Received verbal understanding.

## 2019-04-27 NOTE — Anesthesia Procedure Notes (Signed)
Procedure Name: LMA Insertion Date/Time: 04/27/2019 3:10 PM Performed by: Moshe Salisbury, CRNA Pre-anesthesia Checklist: Patient identified, Emergency Drugs available, Suction available and Patient being monitored Patient Re-evaluated:Patient Re-evaluated prior to induction Oxygen Delivery Method: Circle System Utilized Preoxygenation: Pre-oxygenation with 100% oxygen Induction Type: IV induction Ventilation: Mask ventilation without difficulty LMA: LMA inserted LMA Size: 4.0 Number of attempts: 1 Airway Equipment and Method: Bite block Placement Confirmation: positive ETCO2 Tube secured with: Tape Dental Injury: Teeth and Oropharynx as per pre-operative assessment

## 2019-04-27 NOTE — Op Note (Signed)
Left Breast Radioactive seed localized lumpectomy and sentinel lymph node biopsy, Right subclavian port placement  Indications: This patient presents with history of left breast cancer, triple negative, upper inner quadrant, cT1bN0,   Pre-operative Diagnosis: triple negative left breast cancer  Post-operative Diagnosis: Same  Surgeon: Stark Klein   Anesthesia: General endotracheal anesthesia  ASA Class: 2  Procedure Details  The patient was seen in the Holding Room. The risks, benefits, complications, treatment options, and expected outcomes were discussed with the patient. The possibilities of bleeding, infection, the need for additional procedures, failure to diagnose a condition, and creating a complication requiring transfusion or operation were discussed with the patient. The patient concurred with the proposed plan, giving informed consent.  The site of surgery properly noted/marked. The patient was taken to Operating Room # 2, identified, and the procedure verified as Left Breast seed localized Lumpectomy with sentinel lymph node biopsy and port placement. A Time Out was held and the above information confirmed.  The left arm, bilateral breast, and chest were prepped and draped in standard fashion.  The port was placed first.    Local anesthetic was administered over this   area at the angle of the clavicle.  The vein was accessed with 1 pass(es) of the needle. There was good venous return and the wire would not pass.  A different angle was taken and this time the wire passed easily with brief ectopy.   Fluoroscopy was used to confirm that the wire was in the vena cava.      The patient was placed back level and the area for the pocket was anethetized   with local anesthetic.  A 3-cm transverse incision was made with a #15   blade.  Cautery was used to divide the subcutaneous tissues down to the   pectoralis muscle.  An [employer] retractor was used to elevate the skin   while a pocket  was created on top of the pectoralis fascia.  The port   was placed into the pocket to confirm that it was of adequate size.  The   catheter was preattached to the port.  The port was then secured to the   pectoralis fascia with four 2-0 Prolene sutures.  These were clamped and   not tied down yet.    The catheter was tunneled through to the wire exit   site.  The catheter was placed along the wire to determine what length it should be to be in the SVC.  The catheter was cut at 18 cm.  The tunneler sheath and dilator were passed over the wire and the dilator and wire were removed.  The catheter was advanced through the tunneler sheath and the tunneler sheath was pulled away.  Care was taken to keep the catheter in the tunneler sheath as this occurred. This was advanced and the tunneler sheath was removed.  There was good venous   return and easy flush of the catheter.  The Prolene sutures were tied   down to the pectoral fascia.  The skin was reapproximated using 3-0   Vicryl interrupted deep dermal sutures.    Fluoroscopy was used to re-confirm good position of the catheter.  The skin   was then closed using 4-0 Monocryl in a subcuticular fashion.  The port was flushed with concentrated heparin flush as well.     The lumpectomy was performed by creating a superomedial circumareolar incision near the previously placed radioactive seed.  Dissection was carried down to around  the point of maximum signal intensity. The cautery was used to perform the dissection.  Hemostasis was achieved with cautery. The edges of the cavity were marked with large clips, with one each medial, lateral, inferior and superior, and two clips posteriorly.   The specimen was inked with the margin marker paint kit.    Specimen radiography confirmed inclusion of the mammographic lesion, the clip, and the seed.  The background signal in the breast was zero.  The wound was irrigated and closed with 3-0 vicryl in layers and 4-0  monocryl subcuticular suture.    Using a hand-held gamma probe, left axillary sentinel nodes were identified transcutaneously.  An oblique incision was created below the axillary hairline.  Dissection was carried through the clavipectoral fascia.  Three deep level 2 axillary sentinel nodes were removed.  Counts per second were 0 (palpable), 255, and 141.    The background count was 0 cps.  The wound was irrigated.  Hemostasis was achieved with cautery.  The axillary incision was closed with a 3-0 vicryl deep dermal interrupted sutures and a 4-0 monocryl subcuticular closure.    Sterile dressings were applied. At the end of the operation, all sponge, instrument, and needle counts were correct.  Findings: Catheter tip cut at 18 cm, port tip located SVC, grossly clear surgical margins and no adenopathy  Estimated Blood Loss:  min         Specimens: left breast lumpectomy and three deep left axillary sentinel lymph nodes.             Complications:  None; patient tolerated the procedure well.         Disposition: PACU - hemodynamically stable.         Condition: stable

## 2019-04-28 ENCOUNTER — Encounter (HOSPITAL_COMMUNITY): Payer: Self-pay | Admitting: General Surgery

## 2019-04-28 NOTE — Anesthesia Postprocedure Evaluation (Signed)
Anesthesia Post Note  Patient: Laura Fisher, general practice  Procedure(s) Performed: LEFT BREAST LUMPECTOMY WITH RADIOACTIVE SEED AND SENTINEL LYMPH NODE BIOPSY (Left Breast) INSERTION PORT-A-CATH WITH ULTRASOUND (Right Chest)     Patient location during evaluation: PACU Anesthesia Type: General Level of consciousness: awake Vital Signs Assessment: post-procedure vital signs reviewed and stable Respiratory status: spontaneous breathing Postop Assessment: no apparent nausea or vomiting Anesthetic complications: no    Last Vitals:  Vitals:   04/27/19 1806 04/27/19 1815  BP: (!) 155/99 (!) 155/99  Pulse:  70  Resp: 19 11  Temp:  36.4 C  SpO2:  97%    Last Pain:  Vitals:   04/27/19 1759  TempSrc:   PainSc: 7                  Khaleah Duer

## 2019-05-01 ENCOUNTER — Other Ambulatory Visit: Payer: Self-pay | Admitting: General Surgery

## 2019-05-01 ENCOUNTER — Encounter: Payer: Self-pay | Admitting: *Deleted

## 2019-05-01 ENCOUNTER — Telehealth: Payer: Self-pay | Admitting: General Surgery

## 2019-05-01 ENCOUNTER — Telehealth: Payer: Self-pay | Admitting: Hematology and Oncology

## 2019-05-01 NOTE — Telephone Encounter (Signed)
Discussed pathology and positive anterior margin.  Will plan reexcision lumpectomy UNLESS genetic testing is positive.  If she has genetic mutation, she will likely want bilateral mastectomies.  In that case, I would discuss with Dr. Lindi Adie timing of chemo.  I would suspect that we would do chemo first and then proceed with mastectomies.

## 2019-05-01 NOTE — Telephone Encounter (Signed)
Scheduled appt per 10/1sch message  - pt is aware of appt date and time

## 2019-05-03 ENCOUNTER — Encounter (HOSPITAL_BASED_OUTPATIENT_CLINIC_OR_DEPARTMENT_OTHER): Payer: Self-pay

## 2019-05-03 ENCOUNTER — Other Ambulatory Visit: Payer: Self-pay

## 2019-05-04 ENCOUNTER — Telehealth: Payer: Self-pay | Admitting: *Deleted

## 2019-05-04 NOTE — Telephone Encounter (Signed)
Left vm for pt regarding appts for 10/12. R/s post-op and chemo class for 10/21. Appt date and time given. Informed Dr. Marlowe Aschoff nurse of change as well so pt may quarantine after covid testing.

## 2019-05-05 ENCOUNTER — Other Ambulatory Visit (HOSPITAL_COMMUNITY)
Admission: RE | Admit: 2019-05-05 | Discharge: 2019-05-05 | Disposition: A | Discharge status: Home / Self Care | Payer: Medicare Other | Source: Ambulatory Visit | Attending: General Surgery | Admitting: General Surgery

## 2019-05-05 DIAGNOSIS — Z01812 Encounter for preprocedural laboratory examination: Secondary | ICD-10-CM | POA: Insufficient documentation

## 2019-05-05 DIAGNOSIS — Z20828 Contact with and (suspected) exposure to other viral communicable diseases: Secondary | ICD-10-CM | POA: Insufficient documentation

## 2019-05-05 NOTE — Progress Notes (Signed)

## 2019-05-08 ENCOUNTER — Telehealth: Payer: Self-pay | Admitting: Licensed Clinical Social Worker

## 2019-05-08 ENCOUNTER — Other Ambulatory Visit: Payer: Medicare Other

## 2019-05-08 ENCOUNTER — Ambulatory Visit: Payer: Medicare Other | Admitting: Hematology and Oncology

## 2019-05-08 LAB — NOVEL CORONAVIRUS, NAA (HOSP ORDER, SEND-OUT TO REF LAB; TAT 18-24 HRS): SARS-CoV-2, NAA: NOT DETECTED

## 2019-05-08 NOTE — Telephone Encounter (Signed)
Called to touch base with patient, results do not look like they will be back before her surgery tomorrow. I let her know I'll keep an eye on them tonight and tomorrow morning and call her if they do come back.

## 2019-05-09 ENCOUNTER — Ambulatory Visit (HOSPITAL_BASED_OUTPATIENT_CLINIC_OR_DEPARTMENT_OTHER)
Admission: RE | Admit: 2019-05-09 | Discharge: 2019-05-09 | Disposition: A | Discharge status: Home / Self Care | Payer: Medicare Other | Attending: General Surgery | Admitting: General Surgery

## 2019-05-09 ENCOUNTER — Ambulatory Visit (HOSPITAL_BASED_OUTPATIENT_CLINIC_OR_DEPARTMENT_OTHER): Payer: Medicare Other | Admitting: Anesthesiology

## 2019-05-09 ENCOUNTER — Encounter (HOSPITAL_BASED_OUTPATIENT_CLINIC_OR_DEPARTMENT_OTHER): Payer: Self-pay | Admitting: *Deleted

## 2019-05-09 ENCOUNTER — Encounter (HOSPITAL_BASED_OUTPATIENT_CLINIC_OR_DEPARTMENT_OTHER)
Admission: RE | Disposition: A | Discharge status: Home / Self Care | Payer: Self-pay | Source: Home / Self Care | Attending: General Surgery

## 2019-05-09 DIAGNOSIS — I10 Essential (primary) hypertension: Secondary | ICD-10-CM | POA: Diagnosis not present

## 2019-05-09 DIAGNOSIS — Z171 Estrogen receptor negative status [ER-]: Secondary | ICD-10-CM | POA: Diagnosis not present

## 2019-05-09 DIAGNOSIS — G4730 Sleep apnea, unspecified: Secondary | ICD-10-CM | POA: Diagnosis not present

## 2019-05-09 DIAGNOSIS — C50212 Malignant neoplasm of upper-inner quadrant of left female breast: Secondary | ICD-10-CM | POA: Insufficient documentation

## 2019-05-09 DIAGNOSIS — Z800 Family history of malignant neoplasm of digestive organs: Secondary | ICD-10-CM | POA: Diagnosis not present

## 2019-05-09 DIAGNOSIS — Z806 Family history of leukemia: Secondary | ICD-10-CM | POA: Diagnosis not present

## 2019-05-09 HISTORY — PX: RE-EXCISION OF BREAST LUMPECTOMY: SHX6048

## 2019-05-09 SURGERY — EXCISION, LESION, BREAST
Anesthesia: General | Site: Breast | Laterality: Left

## 2019-05-09 MED ORDER — ACETAMINOPHEN 500 MG PO TABS
1000.0000 mg | ORAL_TABLET | ORAL | Status: AC
  Administered 2019-05-09: 1000 mg via ORAL

## 2019-05-09 MED ORDER — ACETAMINOPHEN 500 MG PO TABS
ORAL_TABLET | ORAL | Status: AC
  Filled 2019-05-09: qty 2

## 2019-05-09 MED ORDER — LIDOCAINE-EPINEPHRINE (PF) 1 %-1:200000 IJ SOLN
INTRAMUSCULAR | Status: DC | PRN
  Administered 2019-05-09: 40 mL

## 2019-05-09 MED ORDER — MIDAZOLAM HCL 2 MG/2ML IJ SOLN
INTRAMUSCULAR | Status: AC
  Filled 2019-05-09: qty 2

## 2019-05-09 MED ORDER — 0.9 % SODIUM CHLORIDE (POUR BTL) OPTIME
TOPICAL | Status: DC | PRN
  Administered 2019-05-09: 200 mL

## 2019-05-09 MED ORDER — CHLORHEXIDINE GLUCONATE CLOTH 2 % EX PADS: 6.0000 | MEDICATED_PAD | Freq: Once | CUTANEOUS | Status: DC

## 2019-05-09 MED ORDER — DEXAMETHASONE SODIUM PHOSPHATE 4 MG/ML IJ SOLN
INTRAMUSCULAR | Status: DC | PRN
  Administered 2019-05-09: 10 mg via INTRAVENOUS

## 2019-05-09 MED ORDER — FENTANYL CITRATE (PF) 100 MCG/2ML IJ SOLN
INTRAMUSCULAR | Status: DC | PRN
  Administered 2019-05-09 (×2): 50 ug via INTRAVENOUS

## 2019-05-09 MED ORDER — FENTANYL CITRATE (PF) 100 MCG/2ML IJ SOLN
INTRAMUSCULAR | Status: AC
  Filled 2019-05-09: qty 2

## 2019-05-09 MED ORDER — MIDAZOLAM HCL 5 MG/5ML IJ SOLN
INTRAMUSCULAR | Status: DC | PRN
  Administered 2019-05-09: 2 mg via INTRAVENOUS

## 2019-05-09 MED ORDER — CEFAZOLIN SODIUM-DEXTROSE 2-4 GM/100ML-% IV SOLN
INTRAVENOUS | Status: AC
  Filled 2019-05-09: qty 100

## 2019-05-09 MED ORDER — LIDOCAINE 2% (20 MG/ML) 5 ML SYRINGE
INTRAMUSCULAR | Status: DC | PRN
  Administered 2019-05-09: 60 mg via INTRAVENOUS

## 2019-05-09 MED ORDER — LACTATED RINGERS IV SOLN
INTRAVENOUS | Status: DC
  Administered 2019-05-09: 14:00:00 via INTRAVENOUS

## 2019-05-09 MED ORDER — PROPOFOL 10 MG/ML IV BOLUS
INTRAVENOUS | Status: DC | PRN
  Administered 2019-05-09: 150 mg via INTRAVENOUS

## 2019-05-09 MED ORDER — ONDANSETRON HCL 4 MG/2ML IJ SOLN
INTRAMUSCULAR | Status: DC | PRN
  Administered 2019-05-09: 4 mg via INTRAVENOUS

## 2019-05-09 MED ORDER — CEFAZOLIN SODIUM-DEXTROSE 2-4 GM/100ML-% IV SOLN
2.0000 g | INTRAVENOUS | Status: AC
  Administered 2019-05-09: 14:00:00 2 g via INTRAVENOUS

## 2019-05-09 MED ORDER — PHENYLEPHRINE 40 MCG/ML (10ML) SYRINGE FOR IV PUSH (FOR BLOOD PRESSURE SUPPORT)
PREFILLED_SYRINGE | INTRAVENOUS | Status: DC | PRN
  Administered 2019-05-09 (×2): 80 ug via INTRAVENOUS
  Administered 2019-05-09: 40 ug via INTRAVENOUS

## 2019-05-09 SURGICAL SUPPLY — 55 items
BINDER BREAST 3XL (GAUZE/BANDAGES/DRESSINGS) IMPLANT
BINDER BREAST LRG (GAUZE/BANDAGES/DRESSINGS) IMPLANT
BINDER BREAST MEDIUM (GAUZE/BANDAGES/DRESSINGS) IMPLANT
BINDER BREAST XLRG (GAUZE/BANDAGES/DRESSINGS) IMPLANT
BINDER BREAST XXLRG (GAUZE/BANDAGES/DRESSINGS) IMPLANT
BLADE SURG 10 STRL SS (BLADE) ×3 IMPLANT
BLADE SURG 15 STRL LF DISP TIS (BLADE) ×1 IMPLANT
BLADE SURG 15 STRL SS (BLADE) ×2
CANISTER SUCT 1200ML W/VALVE (MISCELLANEOUS) ×3 IMPLANT
CHLORAPREP W/TINT 26 (MISCELLANEOUS) ×3 IMPLANT
CLIP VESOCCLUDE LG 6/CT (CLIP) IMPLANT
CLOSURE WOUND 1/2 X4 (GAUZE/BANDAGES/DRESSINGS) ×1
COVER BACK TABLE REUSABLE LG (DRAPES) ×3 IMPLANT
COVER MAYO STAND REUSABLE (DRAPES) ×3 IMPLANT
COVER WAND RF STERILE (DRAPES) IMPLANT
DECANTER SPIKE VIAL GLASS SM (MISCELLANEOUS) IMPLANT
DERMABOND ADVANCED (GAUZE/BANDAGES/DRESSINGS) ×2
DERMABOND ADVANCED .7 DNX12 (GAUZE/BANDAGES/DRESSINGS) ×1 IMPLANT
DRAPE LAPAROSCOPIC ABDOMINAL (DRAPES) ×3 IMPLANT
DRAPE UTILITY XL STRL (DRAPES) ×3 IMPLANT
ELECT COATED BLADE 2.86 ST (ELECTRODE) ×3 IMPLANT
ELECT REM PT RETURN 9FT ADLT (ELECTROSURGICAL) ×3
ELECTRODE REM PT RTRN 9FT ADLT (ELECTROSURGICAL) ×1 IMPLANT
GAUZE SPONGE 4X4 12PLY STRL (GAUZE/BANDAGES/DRESSINGS) IMPLANT
GAUZE SPONGE 4X4 12PLY STRL LF (GAUZE/BANDAGES/DRESSINGS) ×3 IMPLANT
GLOVE BIO SURGEON STRL SZ 6 (GLOVE) ×3 IMPLANT
GLOVE BIO SURGEON STRL SZ 6.5 (GLOVE) ×4 IMPLANT
GLOVE BIO SURGEONS STRL SZ 6.5 (GLOVE) ×2
GLOVE BIOGEL PI IND STRL 6.5 (GLOVE) ×4 IMPLANT
GLOVE BIOGEL PI INDICATOR 6.5 (GLOVE) ×8
GOWN STRL REUS W/ TWL LRG LVL3 (GOWN DISPOSABLE) ×1 IMPLANT
GOWN STRL REUS W/TWL 2XL LVL3 (GOWN DISPOSABLE) ×3 IMPLANT
GOWN STRL REUS W/TWL LRG LVL3 (GOWN DISPOSABLE) ×2
KIT MARKER MARGIN INK (KITS) ×3 IMPLANT
NEEDLE HYPO 25X1 1.5 SAFETY (NEEDLE) ×3 IMPLANT
NS IRRIG 1000ML POUR BTL (IV SOLUTION) ×3 IMPLANT
PACK BASIN DAY SURGERY FS (CUSTOM PROCEDURE TRAY) ×3 IMPLANT
PENCIL BUTTON HOLSTER BLD 10FT (ELECTRODE) ×3 IMPLANT
SLEEVE SCD COMPRESS KNEE MED (MISCELLANEOUS) ×3 IMPLANT
SPONGE LAP 18X18 RF (DISPOSABLE) ×3 IMPLANT
STAPLER VISISTAT 35W (STAPLE) IMPLANT
STRIP CLOSURE SKIN 1/2X4 (GAUZE/BANDAGES/DRESSINGS) ×2 IMPLANT
SUT MON AB 4-0 PC3 18 (SUTURE) ×3 IMPLANT
SUT SILK 2 0 SH (SUTURE) ×3 IMPLANT
SUT VIC AB 3-0 54X BRD REEL (SUTURE) IMPLANT
SUT VIC AB 3-0 BRD 54 (SUTURE)
SUT VIC AB 3-0 SH 27 (SUTURE) ×2
SUT VIC AB 3-0 SH 27X BRD (SUTURE) ×1 IMPLANT
SYR BULB 3OZ (MISCELLANEOUS) ×3 IMPLANT
SYR BULB IRRIGATION 50ML (SYRINGE) ×3 IMPLANT
SYR CONTROL 10ML LL (SYRINGE) ×3 IMPLANT
TOWEL GREEN STERILE FF (TOWEL DISPOSABLE) ×3 IMPLANT
TUBE CONNECTING 20'X1/4 (TUBING) ×1
TUBE CONNECTING 20X1/4 (TUBING) ×2 IMPLANT
YANKAUER SUCT BULB TIP NO VENT (SUCTIONS) ×3 IMPLANT

## 2019-05-09 NOTE — Anesthesia Procedure Notes (Signed)
Procedure Name: LMA Insertion Date/Time: 05/09/2019 2:15 PM Performed by: Eulas Post, Laketha Leopard W, CRNA Pre-anesthesia Checklist: Patient identified, Emergency Drugs available, Suction available and Patient being monitored Patient Re-evaluated:Patient Re-evaluated prior to induction Oxygen Delivery Method: Circle system utilized Preoxygenation: Pre-oxygenation with 100% oxygen Induction Type: IV induction Ventilation: Mask ventilation without difficulty LMA: LMA inserted LMA Size: 4.0 Number of attempts: 1 Placement Confirmation: positive ETCO2 and breath sounds checked- equal and bilateral Tube secured with: Tape Dental Injury: Teeth and Oropharynx as per pre-operative assessment

## 2019-05-09 NOTE — Anesthesia Preprocedure Evaluation (Signed)
Anesthesia Evaluation  Patient identified by MRN, date of birth, ID band Patient awake    Reviewed: Allergy & Precautions, H&P , NPO status , Patient's Chart, lab work & pertinent test results, reviewed documented beta blocker date and time   History of Anesthesia Complications (+) Family history of anesthesia reaction and history of anesthetic complications  Airway Mallampati: II  TM Distance: >3 FB Neck ROM: full    Dental no notable dental hx. (+) Teeth Intact, Dental Advisory Given   Pulmonary sleep apnea and Continuous Positive Airway Pressure Ventilation ,    breath sounds clear to auscultation   + intubated    Cardiovascular Exercise Tolerance: Good hypertension, negative cardio ROS   Rhythm:regular Rate:Normal  EKG: 04/24/19: Sinus rhythm with pvc Poor R wave progression Abnormal ECG No previous ECGs available Confirmed by Buford Dresser [phone]) on 04/24/2019 8:52:25 PM   CV: Echo 04/24/19: IMPRESSIONS 1. Left ventricular ejection fraction, by visual estimation, is 55 to 60%. Normal left ventricular size. There is no left ventricular hypertrophy. GLS -19%. 2. Left ventricular diastolic Doppler parameters are consistent with impaired relaxation pattern of LV diastolic filling. 3. Mild mitral annular calcification. 4. The mitral valve is normal in structure. No evidence of mitral valve regurgitation. No evidence of mitral stenosis.   Neuro/Psych negative neurological ROS  negative psych ROS   GI/Hepatic negative GI ROS, Neg liver ROS, GERD  ,  Endo/Other  negative endocrine ROS  Renal/GU negative Renal ROS  negative genitourinary   Musculoskeletal  (+) Arthritis , Osteoarthritis,    Abdominal (+) + obese,   Peds  Hematology negative hematology ROS (+)   Anesthesia Other Findings   Reproductive/Obstetrics negative OB ROS                             Anesthesia  Physical  Anesthesia Plan  ASA: II  Anesthesia Plan: General   Post-op Pain Management:    Induction: Intravenous  PONV Risk Score and Plan: 3 and Ondansetron, Dexamethasone, Treatment may vary due to age or medical condition and Midazolam  Airway Management Planned: Oral ETT and LMA  Additional Equipment:   Intra-op Plan:   Post-operative Plan: Extubation in OR  Informed Consent: I have reviewed the patients History and Physical, chart, labs and discussed the procedure including the risks, benefits and alternatives for the proposed anesthesia with the patient or authorized representative who has indicated his/her understanding and acceptance.     Dental Advisory Given  Plan Discussed with: CRNA, Anesthesiologist and Surgeon  Anesthesia Plan Comments: (PAT note written 04/26/2019 by Myra Gianotti, PA-C. Family history of pseudocholinesterase deficiency. )        Anesthesia Quick Evaluation

## 2019-05-09 NOTE — Op Note (Signed)
Re-excisional left Breast Lumpectomy   Indications: This patient presents with history of positive anterior margin after partial mastectomy for left breast cancer   Pre-operative Diagnosis: left breast cancer   Post-operative Diagnosis: left breast cancer   Surgeon: Stark Klein   Assistants: n/a   Anesthesia: General anesthesia and Local anesthesia   ASA Class: 2   Procedure Details  The patient was seen in the Holding Room. The risks, benefits, complications, treatment options, and expected outcomes were discussed with the patient. The possibilities of reaction to medication, pulmonary aspiration, bleeding, infection, the need for additional procedures, failure to diagnose a condition, and creating a complication requiring transfusion or operation were discussed with the patient. The patient concurred with the proposed plan, giving informed consent. The site of surgery properly noted/marked. The patient was taken to Operating Room # 8, identified, and the procedure verified as re-excision of left breast cancer.  After induction of anesthesia, the left breast and chest were prepped and draped in standard fashion. The lumpectomy was performed by reopening the prior incision. Seroma was aspirated. The mastopexy sutures were removed. Additional margins were taken at the anterior border of the partial mastectomy cavity.  The margin was marked with the paint kit. Hemostasis was achieved with cautery. Additional 2-0 vicryl mastopexy sutures were placed.  The wound was irrigated and closed with a 3-0 Vicryl deep dermal interrupted and a 4-0 Monocryl subcuticular closure in layers.  Sterile dressings were applied. At the end of the operation, all sponge, instrument, and needle counts were correct.   Findings:  grossly clear surgical margins, anterior margin is skin.    Estimated Blood Loss: Minimal   Drains: none   Specimens: additional anterior margin  Complications: None; patient tolerated  the procedure well.   Disposition: PACU - hemodynamically stable.   Condition: stable

## 2019-05-09 NOTE — Transfer of Care (Signed)
Immediate Anesthesia Transfer of Care Note  Patient: Laura Fisher Doctor, general practice  Procedure(s) Performed: RE-EXCISION OF LEFT BREAST LUMPECTOMY (Left Breast)  Patient Location: PACU  Anesthesia Type:General  Level of Consciousness: awake, alert , oriented and patient cooperative  Airway & Oxygen Therapy: Patient Spontanous Breathing and Patient connected to face mask oxygen  Post-op Assessment: Report given to RN and Post -op Vital signs reviewed and stable  Post vital signs: Reviewed and stable  Last Vitals:  Vitals Value Taken Time  BP 111/75 05/09/19 1502  Temp    Pulse 72 05/09/19 1503  Resp 14 05/09/19 1503  SpO2 99 % 05/09/19 1503  Vitals shown include unvalidated device data.  Last Pain:  Vitals:   05/09/19 1330  TempSrc: Oral  PainSc: 0-No pain         Complications: No apparent anesthesia complications

## 2019-05-09 NOTE — Discharge Instructions (Addendum)
[facility] °Office Phone Number [phone] ° °BREAST BIOPSY/ PARTIAL MASTECTOMY: POST OP INSTRUCTIONS ° °Always review your discharge instruction sheet given to you by the facility where your surgery was performed. ° °IF YOU HAVE DISABILITY OR FAMILY LEAVE FORMS, YOU MUST BRING THEM TO THE OFFICE FOR PROCESSING.  DO NOT GIVE THEM TO YOUR DOCTOR. ° °1. A prescription for pain medication may be given to you upon discharge.  Take your pain medication as prescribed, if needed.  If narcotic pain medicine is not needed, then you may take acetaminophen (Tylenol) or ibuprofen (Advil) as needed. °2. Take your usually prescribed medications unless otherwise directed °3. If you need a refill on your pain medication, please contact your pharmacy.  They will contact our office to request authorization.  Prescriptions will not be filled after 5pm or on week-ends. °4. You should eat very light the first 24 hours after surgery, such as soup, crackers, pudding, etc.  Resume your normal diet the day after surgery. °5. Most patients will experience some swelling and bruising in the breast.  Ice packs and a good support bra will help.  Swelling and bruising can take several days to resolve.  °6. It is common to experience some constipation if taking pain medication after surgery.  Increasing fluid intake and taking a stool softener will usually help or prevent this problem from occurring.  A mild laxative (Milk of Magnesia or Miralax) should be taken according to package directions if there are no bowel movements after 48 hours. °7. Unless discharge instructions indicate otherwise, you may remove your bandages 48 hours after surgery, and you may shower at that time.  You may have steri-strips (small skin tapes) in place directly over the incision.  These strips should be left on the skin for 7-10 days.   Any sutures or staples will be removed at the office during your follow-up visit. °8. ACTIVITIES:  You may resume  regular daily activities (gradually increasing) beginning the next day.  Wearing a good support bra or sports bra (or the breast binder) minimizes pain and swelling.  You may have sexual intercourse when it is comfortable. °a. You may drive when you no longer are taking prescription pain medication, you can comfortably wear a seatbelt, and you can safely maneuver your car and apply brakes. °b. RETURN TO WORK:  __________1 week_______________ °9. You should see your doctor in the office for a follow-up appointment approximately two weeks after your surgery.  Your doctor’s nurse will typically make your follow-up appointment when she calls you with your pathology report.  Expect your pathology report 2-3 business days after your surgery.  You may call to check if you do not hear from us after three days. ° ° °WHEN TO CALL YOUR DOCTOR: °1. Fever over 101.0 °2. Nausea and/or vomiting. °3. Extreme swelling or bruising. °4. Continued bleeding from incision. °5. Increased pain, redness, or drainage from the incision. ° °The clinic staff is available to answer your questions during regular business hours.  Please don’t hesitate to call and ask to speak to one of the nurses for clinical concerns.  If you have a medical emergency, go to the nearest emergency room or call 911.  A surgeon from [facility] is always on call at the hospital. ° °For further questions, please visit [email or web address]  ° ° °Post Anesthesia Home Care Instructions ° °Activity: °Get plenty of rest for the remainder of the day. A responsible individual must stay with you for 24   hours following the procedure.  °For the next 24 hours, DO NOT: °-Drive a car °-Operate machinery °-Drink alcoholic beverages °-Take any medication unless instructed by your physician °-Make any legal decisions or sign important papers. ° °Meals: °Start with liquid foods such as gelatin or soup. Progress to regular foods as tolerated. Avoid greasy, spicy,  heavy foods. If nausea and/or vomiting occur, drink only clear liquids until the nausea and/or vomiting subsides. Call your physician if vomiting continues. ° °Special Instructions/Symptoms: °Your throat may feel dry or sore from the anesthesia or the breathing tube placed in your throat during surgery. If this causes discomfort, gargle with warm salt water. The discomfort should disappear within 24 hours. ° °If you had a scopolamine patch placed behind your ear for the management of post- operative nausea and/or vomiting: ° °1. The medication in the patch is effective for 72 hours, after which it should be removed.  Wrap patch in a tissue and discard in the trash. Wash hands thoroughly with soap and water. °2. You may remove the patch earlier than 72 hours if you experience unpleasant side effects which may include dry mouth, dizziness or visual disturbances. °3. Avoid touching the patch. Wash your hands with soap and water after contact with the patch. °  ° °

## 2019-05-09 NOTE — Interval H&P Note (Signed)
History and Physical Interval Note:  05/09/2019 1:52 PM  Laura Fisher  has presented today for surgery, with the diagnosis of LEFT BREAST CANCER.  The various methods of treatment have been discussed with the patient and family. After consideration of risks, benefits and other options for treatment, the patient has consented to  Procedure(s): RE-EXCISION OF LEFT BREAST LUMPECTOMY (Left) as a surgical intervention.  The patient's history has been reviewed, patient examined, no change in status, stable for surgery.  I have reviewed the patient's chart and labs.  Questions were answered to the patient's satisfaction.     Stark Klein

## 2019-05-10 ENCOUNTER — Encounter (HOSPITAL_BASED_OUTPATIENT_CLINIC_OR_DEPARTMENT_OTHER): Payer: Self-pay | Admitting: General Surgery

## 2019-05-10 NOTE — Anesthesia Postprocedure Evaluation (Signed)
Anesthesia Post Note  Patient: Jalonda Doctor, general practice  Procedure(s) Performed: RE-EXCISION OF LEFT BREAST LUMPECTOMY (Left Breast)     Patient location during evaluation: PACU Anesthesia Type: General Level of consciousness: awake and alert Pain management: pain level controlled Vital Signs Assessment: post-procedure vital signs reviewed and stable Respiratory status: spontaneous breathing, nonlabored ventilation, respiratory function stable and patient connected to nasal cannula oxygen Cardiovascular status: blood pressure returned to baseline and stable Postop Assessment: no apparent nausea or vomiting Anesthetic complications: no    Last Vitals:  Vitals:   05/09/19 1545 05/09/19 1558  BP: 116/79 137/81  Pulse:  62  Resp:  16  Temp:  36.4 C  SpO2:  98%    Last Pain:  Vitals:   05/09/19 1558  TempSrc:   PainSc: 2                  Izaac Reisig

## 2019-05-11 ENCOUNTER — Encounter: Payer: Self-pay | Admitting: *Deleted

## 2019-05-11 LAB — SURGICAL PATHOLOGY

## 2019-05-12 ENCOUNTER — Encounter: Payer: Self-pay | Admitting: Licensed Clinical Social Worker

## 2019-05-12 ENCOUNTER — Ambulatory Visit: Payer: Self-pay | Admitting: Licensed Clinical Social Worker

## 2019-05-12 ENCOUNTER — Telehealth: Payer: Self-pay | Admitting: Licensed Clinical Social Worker

## 2019-05-12 DIAGNOSIS — C50212 Malignant neoplasm of upper-inner quadrant of left female breast: Secondary | ICD-10-CM

## 2019-05-12 DIAGNOSIS — Z806 Family history of leukemia: Secondary | ICD-10-CM

## 2019-05-12 DIAGNOSIS — Z1379 Encounter for other screening for genetic and chromosomal anomalies: Secondary | ICD-10-CM

## 2019-05-12 DIAGNOSIS — Z171 Estrogen receptor negative status [ER-]: Secondary | ICD-10-CM

## 2019-05-12 DIAGNOSIS — Z803 Family history of malignant neoplasm of breast: Secondary | ICD-10-CM

## 2019-05-12 DIAGNOSIS — Z800 Family history of malignant neoplasm of digestive organs: Secondary | ICD-10-CM

## 2019-05-12 NOTE — Progress Notes (Addendum)
HPI:  [name] was previously seen in the [place] clinic due to a recent diagnosis of breast cancer and family history of cancer and concerns regarding a hereditary predisposition to cancer. Please refer to our prior cancer genetics clinic note for more information regarding our discussion, assessment and recommendations, at the time. [name] recent genetic test results were disclosed to her, as were recommendations warranted by these results. These results and recommendations are discussed in more detail below.  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 No pathogenic variants identified. The STAT Breast cancer panel offered by [facility] includes sequencing and rearrangement analysis for the following 9 genes:  ATM, BRCA1, BRCA2, CDH1, CHEK2, PALB2, PTEN, STK11 and TP53.  The Common Hereditary Cancers Panel offered by [facility] includes sequencing and/or deletion duplication testing of the following 47 genes: APC, ATM, AXIN2, BARD1, BMPR1A, BRCA1, BRCA2, BRIP1, CDH1, CDKN2A (p14ARF), CDKN2A (p16INK4a), CKD4, CHEK2, CTNNA1, DICER1, EPCAM (Deletion/duplication testing only), GREM1 (promoter region deletion/duplication testing only), KIT, MEN1, MLH1, MSH2, MSH3, MSH6, MUTYH, NBN, NF1, NHTL1, PALB2, PDGFRA, PMS2,  POLD1, POLE, PTEN, RAD50, RAD51C, RAD51D, SDHB, SDHC, SDHD, SMAD4, SMARCA4. STK11, TP53, TSC1, TSC2, and VHL.  The following genes were evaluated for sequence changes only: SDHA and HOXB13 c.251G>A variant only. The report date is 05/12/2019.      FAMILY HISTORY:  We obtained a detailed, 4-generation family history.  Significant diagnoses are listed below: Family History  Problem Relation Age of Onset  . Tongue cancer Mother 84  . Heart disease Mother   . Heart disease Father   . Diabetes Sister   . Pancreatic cancer Other        Nephew  . Stomach cancer Maternal Aunt   . Leukemia Paternal Aunt   . Breast cancer Cousin 56  . Cancer Cousin        unk type  . Colon cancer Neg Hx   . Colon polyps Neg Hx   . Esophageal cancer Neg Hx   . Rectal cancer Neg Hx     [name] has 2 sons, no history of cancer. She has 1 brother, 78, no history of cancer. She has 1 sister who died in her 35s due to diabetes complications. This sister had a son who had pancreatic cancer and died at 53.   [name] mother was diagnosed with tongue cancer at 50 and died at 63. She also had a basal cell carcinoma on her cheek in her 27s. Patient had 4 maternal aunts, 3 maternal uncles. One of her aunts had stomach cancer in her 36s. No known cancers in maternal cousins. Her maternal grandparents are deceased. Her grandmothers sister had leukemia.  [name]'s father died at 44. Patient had 3 paternal aunts, 3 paternal uncles. One of her aunts had leukemia and died at 65. Another  aunt's daughter had breast cancer at 59. Another cousin possibly has cancer but she is unsure the type. Paternal grandmother died in her 88s due to heart issues, paternal grandfather died in his 92s due to heart issues.  [name] is unaware of previous family history of genetic testing for hereditary cancer risks. Patient's ancestors are of [place], [place] and [place]. There is no reported Ashkenazi Jewish ancestry. There is no known  consanguinity.   GENETIC TEST RESULTS: Genetic testing reported out on 05/12/2019 through the [facility] Breast Cancer STAT Panel + Common Hereditary cancer panel found no pathogenic mutations. The STAT Breast cancer panel offered by [facility] includes sequencing and rearrangement analysis for the following 9 genes:  ATM, BRCA1, BRCA2, CDH1, CHEK2, PALB2, PTEN, STK11 and TP53.  The Common Hereditary Cancers Panel offered by [facility] includes sequencing and/or deletion duplication testing of the following 47 genes: APC, ATM, AXIN2, BARD1, BMPR1A, BRCA1, BRCA2, BRIP1, CDH1, CDKN2A (p14ARF), CDKN2A (p16INK4a), CKD4, CHEK2, CTNNA1, DICER1, EPCAM (Deletion/duplication testing only), GREM1 (promoter region deletion/duplication testing only), KIT, MEN1, MLH1, MSH2, MSH3, MSH6, MUTYH, NBN, NF1, NHTL1, PALB2, PDGFRA, PMS2, POLD1, POLE, PTEN, RAD50, RAD51C, RAD51D, SDHB, SDHC, SDHD, SMAD4, SMARCA4. STK11, TP53, TSC1, TSC2, and VHL.  The following genes were evaluated for sequence changes only: SDHA and HOXB13 c.251G>A variant only. The test report has been scanned into EPIC and is located under the Molecular Pathology section of the Results Review tab.  A portion of the result report is included below for reference.     We discussed with [name] that because current genetic testing is not perfect, it is possible there may be a gene mutation in one of these genes that current testing cannot detect, but that chance is small.  We also discussed, that there could be another gene that has not yet been discovered, or that we have not yet tested, that is responsible for the cancer diagnoses in the family. It is also possible there is a hereditary cause for the cancer in the family that [name] did not inherit and therefore was not identified in her testing.  Therefore, it is important to remain in touch with cancer genetics in the future so that we can continue to offer [name] the most up to date genetic testing.    Genetic testing did identify 2 Variants of uncertain significance (VUS) - one in the RAD51D gene called c.49A>G, a second in the SDHD gene called c.53C>T.  At this time, it is unknown if these variants are associated with increased cancer risk or if they are normal findings, but most variants such as these get reclassified to being inconsequential. They should not be used to make medical management decisions. With time, we suspect the lab will determine the significance of these variants, if any. If we do learn more about them, we will try to contact [name] to discuss it further. However, it is important to stay in touch with [place] periodically and keep the address and phone number up to date.  ADDITIONAL GENETIC TESTING: We discussed with [name] that her genetic testing was fairly extensive.  If there are genes identified to increase cancer risk that can be analyzed in the future, we would be happy to discuss and coordinate this testing at that time.    CANCER SCREENING RECOMMENDATIONS: [name]'s test result is considered negative (normal).  This means that we have not identified a hereditary cause for her personal and family history of cancer at this time. Most  cancers happen by chance and this negative test suggests that her cancer may fall into this category.    While reassuring, this does not definitively rule out a hereditary predisposition to cancer. It is still possible that there could be genetic mutations that are undetectable by current technology. There could be genetic mutations in genes that have not been tested or identified to increase cancer risk.  Therefore, it is recommended she continue to follow the cancer management and screening guidelines provided by her oncology and primary healthcare provider.   An individual's cancer risk and medical management are not determined by genetic test results alone. Overall cancer risk assessment incorporates additional factors, including  personal medical history, family history, and any available genetic information that may result in a personalized plan for cancer prevention and surveillance.  RECOMMENDATIONS FOR FAMILY MEMBERS:  Relatives in this family might be at some increased risk of developing cancer, over the general population risk, simply due to the family history of cancer.  We recommended female relatives in this family have a yearly mammogram beginning at age 41, or 53 years younger than the earliest onset of cancer, an annual clinical breast exam, and perform monthly breast self-exams. Female relatives in this family should also have a gynecological exam as recommended by their primary provider. All family members should have a colonoscopy by age 47, or as directed by their physicians.   It is also possible there is a hereditary cause for the cancer in [name]'s family that she did not inherit and therefore was not identified in her.  Based on [name]'s family history, we recommended her nephews' children have genetic counseling and testing. [name] also asked about her children's risk based on their father's family history. She notes there are several individuals with colon cancer on that side. She will find out more and [name] will let us know if we can be of any assistance in coordinating genetic counseling and/or testing for these family members.  FOLLOW-UP: Lastly, we discussed with [name] that cancer genetics is a rapidly advancing field and it is possible that new genetic tests will be appropriate for her and/or her family members in the future. We encouraged her to remain in contact with cancer genetics on an annual basis so we can update her personal and family histories and let her know of advances in cancer genetics that may benefit this family.   Our contact number was provided. [name] questions were answered to her satisfaction, and she knows she is welcome to call us at anytime with  additional questions or concerns.   Faith Rogue, MS, [facility] Genetic Counselor [email or web address] Phone: [phone]

## 2019-05-12 NOTE — Progress Notes (Signed)
Please let patietn know final margin is negative.

## 2019-05-12 NOTE — Telephone Encounter (Signed)
Revealed negative genetic testing.  Revealed that a VUS in SDHD and RAD51D were identified. This normal result is reassuring and indicates that it is unlikely Laura Fisher's cancer is due to a hereditary cause.  It is unlikely that there is an increased risk of another cancer due to a mutation in one of these genes.  However, genetic testing is not perfect, and cannot definitively rule out a hereditary cause.  It will be important for her to keep in contact with genetics to learn if any additional testing may be needed in the future.

## 2019-05-16 NOTE — Progress Notes (Addendum)
Patient Care Team: Kennieth Rad, MD as PCP - General (Internal Medicine) Mauro Kaufmann, RN as Oncology Nurse Navigator Rockwell Germany, RN as Oncology Nurse Navigator Stark Klein, MD as Consulting Physician (General Surgery) Nicholas Lose, MD as Consulting Physician (Hematology and Oncology) Kyung Rudd, MD as Consulting Physician (Radiation Oncology)  DIAGNOSIS:    ICD-10-CM   1. 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 No pathogenic variants identified. The STAT Breast cancer panel offered by [facility] includes sequencing and rearrangement analysis for the following 9 genes:  ATM, BRCA1, BRCA2, CDH1, CHEK2, PALB2, PTEN, STK11 and TP53.  The Common Hereditary Cancers Panel offered by [facility] includes sequencing and/or deletion duplication testing of the following 47 genes: APC, ATM, AXIN2, BARD1, BMPR1A, BRCA1, BRCA2, BRIP1, CDH1, CDKN2A (p14ARF), CDKN2A (p16INK4a), CKD4, CHEK2, CTNNA1, DICER1, EPCAM (Deletion/duplication testing only), GREM1 (promoter region deletion/duplication testing only), KIT, MEN1, MLH1, MSH2, MSH3, MSH6, MUTYH, NBN, NF1, NHTL1, PALB2, PDGFRA, PMS2,  POLD1, POLE, PTEN, RAD50, RAD51C, RAD51D, SDHB, SDHC, SDHD, SMAD4, SMARCA4. STK11, TP53, TSC1, TSC2, and VHL.  The following genes were evaluated for sequence changes only: SDHA and HOXB13 c.251G>A variant only. The report date is 05/12/2019.    04/27/2019 Surgery   Left lumpectomy [facility]): IDC with high grade DCIS, grade 3, 1.9cm, involved anterior margin, 3 left axillary lymph nodes negative for carcinoma.    05/09/2019 Surgery   Left lumpectomy re-excision [facility]ienes): benign breast tissue, negative for carcinoma.     CHIEF COMPLIANT: Follow-up s/p lumpectomy to review pathology  INTERVAL HISTORY: Laura Fisher is a 63 y.o. with above-mentioned history of left breast cancer. She underwent a lumpectomy on 04/27/19 with Dr. Barry Dienes for which pathology showed invasive ductal carcinoma with high grade DCIS, grade 3, 1.9cm, involved anterior margin, 3 left axillary lymph nodes negative for carcinoma. She underwent a re-excision with Dr. Barry Dienes on 05/09/19 for which pathology showed benign breast tissue, negative for carcinoma. Genetic testing was negative. She presents to the clinic today to review the pathology report and discuss further treatment.   REVIEW OF SYSTEMS:   Constitutional: Denies fevers, chills or abnormal weight loss Eyes: Denies blurriness of vision Ears, nose, mouth, throat, and face: Denies mucositis or sore throat Respiratory: Denies cough, dyspnea or wheezes Cardiovascular: Denies palpitation, chest discomfort Gastrointestinal: Denies nausea, heartburn or change in bowel habits Skin: Denies abnormal skin rashes Lymphatics: Denies new lymphadenopathy or easy bruising Neurological: Denies numbness, tingling or new weaknesses Behavioral/Psych: Mood is stable, no new changes  Extremities: No lower extremity edema Breast: s/p left lumpectomy  All other systems were reviewed with the patient and are negative.  I have reviewed the past medical history, past surgical  history, social history and family history with the patient and they are unchanged from previous note.  ALLERGIES:  is allergic to ciprofloxacin; balsam; benzoic acid; erythromycin; macrobid [nitrofurantoin macrocrystal]; neomycin; nickel; other; quaternium-15; septra [sulfamethoxazole-trimethoprim]; and shellac.  MEDICATIONS:  Current Outpatient Medications  Medication Sig Dispense Refill  . acetaminophen (TYLENOL) 325 MG tablet Take 650 mg by mouth every 6 (six) hours as needed for mild pain or headache.     . benazepril (LOTENSIN) 20 MG tablet Take 20 mg by mouth daily.    . calcium elemental as carbonate (TUMS ULTRA 1000) 400 MG chewable tablet Chew 1,000-2,000 mg by mouth 3 (three) times daily as needed for heartburn.    . Carboxymethylcell-Hypromellose (GENTEAL OP) Place 1 drop into both eyes at bedtime.     . carvedilol (COREG) 3.125 MG tablet Take 1 tablet (3.125 mg total) by mouth 2 (two) times daily with a meal. 60 tablet 5  . Cholecalciferol (VITAMIN D) 125 MCG (5000 UT) CAPS Take 5,000 Units by mouth daily.    Marland Kitchen desonide (DESOWEN) 0.05 % ointment Apply 1 application topically 2 (two) times daily as needed (rash).     . fexofenadine (ALLEGRA) 180 MG tablet Take 90-180 mg by mouth daily as needed for allergies.     . Glucosamine Sulfate 1000 MG TABS Take 1,000 mg by mouth 2 (two) times daily.     Marland Kitchen ibuprofen (ADVIL) 600 MG tablet Take 1 tablet (600 mg total) by mouth every 6 (six) hours as needed for fever or mild pain. 60 tablet 0  . Ketotifen Fumarate (ALLERGY EYE DROPS OP) Place 1 drop into both eyes 2 (two) times daily as needed (allergies).     . Magnesium 250 MG TABS Take 250 mg by mouth daily.     . Multiple Vitamin (MULTIVITAMIN) capsule Take 1 capsule by mouth daily.    . pantoprazole (PROTONIX) 40 MG tablet Take 1 tablet (40 mg total) by mouth daily. (Patient taking differently: Take 40 mg by mouth daily as needed (heartburn). ) 30 tablet 11  . Polyethyl Glycol-Propyl Glycol  (SYSTANE OP) Place 2-4 drops into both eyes every 8 (eight) hours as needed.     . Probiotic Product (PROBIOTIC-10 PO) Take 1 tablet by mouth 2 (two) times daily.     . traMADol (ULTRAM) 50 MG tablet Take 1-2 tablets (50-100 mg total) by mouth every 6 (six) hours as needed for moderate pain or severe pain. 30 tablet 0  . Turmeric 500 MG CAPS Take 500 mg by mouth daily.      No current facility-administered medications for this visit.     PHYSICAL EXAMINATION: ECOG PERFORMANCE STATUS: 1 - Symptomatic but completely ambulatory  Vitals:   05/17/19 0944  BP: (!) 142/89  Pulse: 71  Resp: 17  Temp: 98.3 F (36.8 C)  SpO2: 98%   Filed Weights   05/17/19 0944  Weight: 189 lb 9.6 oz (86 kg)    GENERAL: alert, no distress and comfortable SKIN: skin color, texture, turgor are normal, no rashes or significant lesions EYES: normal, Conjunctiva are pink and non-injected, sclera clear OROPHARYNX: no exudate, no erythema and lips, buccal mucosa, and tongue normal  NECK: supple, thyroid normal size, non-tender, without nodularity LYMPH: no palpable lymphadenopathy in the cervical, axillary or inguinal LUNGS: clear to auscultation and percussion with normal breathing effort HEART: regular rate & rhythm and no murmurs and no lower extremity edema ABDOMEN: abdomen soft, non-tender and normal bowel sounds MUSCULOSKELETAL: no cyanosis of digits and  no clubbing  NEURO: alert & oriented x 3 with fluent speech, no focal motor/sensory deficits EXTREMITIES: No lower extremity edema  LABORATORY DATA:  I have reviewed the data as listed CMP Latest Ref Rng & Units 04/19/2019  Glucose 70 - 99 mg/dL 103(H)  BUN 8 - 23 mg/dL 13  Creatinine 0.44 - 1.00 mg/dL 0.81  Sodium 135 - 145 mmol/L 143  Potassium 3.5 - 5.1 mmol/L 3.9  Chloride 98 - 111 mmol/L 107  CO2 22 - 32 mmol/L 25  Calcium 8.9 - 10.3 mg/dL 9.7  Total Protein 6.5 - 8.1 g/dL 7.8  Total Bilirubin 0.3 - 1.2 mg/dL 0.6  Alkaline Phos 38 - 126  U/L 65  AST 15 - 41 U/L 23  ALT 0 - 44 U/L 29    Lab Results  Component Value Date   WBC 8.4 04/19/2019   HGB 14.8 04/19/2019   HCT 44.2 04/19/2019   MCV 94.2 04/19/2019   PLT 216 04/19/2019   NEUTROABS 5.1 04/19/2019    ASSESSMENT & PLAN:  Malignant neoplasm of upper-inner quadrant of left breast in female, estrogen receptor negative ([place]) 04/13/2019:Mammogram performed in [place] which detected 7 mm left breast mass at 11:30 position, axilla negative, biopsy revealed 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  05/09/2019: Reexcision: Benign  Pathology counseling: I discussed the final pathology report of the patient provided  a copy of this report. I discussed the margins as well as lymph node surgeries. We also discussed the final staging along with previously performed ER/PR and HER-2/neu testing.  Treatment plan: 1.  Adjuvant chemotherapy with Taxotere and Cytoxan every 3 weeks x6 cycles 2.  Adjuvant radiation therapy  Patient had second opinion at [facility] and they are recommending additional mammograms and possible biopsies on both her breasts.  This is happening first week of November.  Patient had 3 pages full of questions that we went through one after the other. She is extremely concerned about delaying chemotherapy and wants to start as soon as possible. Plan to start chemotherapy in 1 week.     No orders of the defined types were placed in this encounter.  The patient has a good understanding of the overall plan. she agrees with it. she will call with any problems that may develop before the next visit here.  Nicholas Lose, MD 05/17/2019  Julious Oka Dorshimer am acting as scribe for Dr. Nicholas Lose.  I have reviewed the above  documentation for accuracy and completeness, and I agree with the above.    Addendum: We identified the area of shingles on the back of her neck did her on Valtrex.

## 2019-05-17 ENCOUNTER — Telehealth: Payer: Self-pay | Admitting: Internal Medicine

## 2019-05-17 ENCOUNTER — Encounter: Payer: BC Managed Care – PPO | Admitting: Internal Medicine

## 2019-05-17 ENCOUNTER — Telehealth: Payer: Self-pay | Admitting: *Deleted

## 2019-05-17 ENCOUNTER — Inpatient Hospital Stay: Payer: Medicare Other

## 2019-05-17 ENCOUNTER — Other Ambulatory Visit: Payer: Self-pay

## 2019-05-17 ENCOUNTER — Encounter: Payer: Self-pay | Admitting: *Deleted

## 2019-05-17 ENCOUNTER — Inpatient Hospital Stay (HOSPITAL_BASED_OUTPATIENT_CLINIC_OR_DEPARTMENT_OTHER): Payer: Medicare Other | Admitting: Hematology and Oncology

## 2019-05-17 VITALS — BP 142/89 | HR 71 | Temp 98.3°F | Resp 17 | Ht 65.0 in | Wt 189.6 lb

## 2019-05-17 DIAGNOSIS — Z452 Encounter for adjustment and management of vascular access device: Secondary | ICD-10-CM | POA: Diagnosis not present

## 2019-05-17 DIAGNOSIS — C50212 Malignant neoplasm of upper-inner quadrant of left female breast: Secondary | ICD-10-CM

## 2019-05-17 DIAGNOSIS — Z171 Estrogen receptor negative status [ER-]: Secondary | ICD-10-CM | POA: Diagnosis not present

## 2019-05-17 DIAGNOSIS — Z86010 Personal history of colonic polyps: Secondary | ICD-10-CM

## 2019-05-17 MED ORDER — NA SULFATE-K SULFATE-MG SULF 17.5-3.13-1.6 GM/177ML PO SOLN: ORAL | 0 refills | Status: DC

## 2019-05-17 MED ORDER — LORAZEPAM 0.5 MG PO TABS: 0.5000 mg | ORAL_TABLET | Freq: Every evening | ORAL | 0 refills | Status: DC | PRN

## 2019-05-17 MED ORDER — LIDOCAINE-PRILOCAINE 2.5-2.5 % EX CREA: TOPICAL_CREAM | CUTANEOUS | 3 refills | Status: AC

## 2019-05-17 MED ORDER — VALACYCLOVIR HCL 1 G PO TABS: 1000.0000 mg | ORAL_TABLET | Freq: Two times a day (BID) | ORAL | 0 refills | Status: AC

## 2019-05-17 MED ORDER — ONDANSETRON HCL 8 MG PO TABS: 8.0000 mg | ORAL_TABLET | Freq: Two times a day (BID) | ORAL | 1 refills | Status: AC | PRN

## 2019-05-17 MED ORDER — ONDANSETRON HCL 8 MG PO TABS: 8.0000 mg | ORAL_TABLET | Freq: Two times a day (BID) | ORAL | 1 refills | Status: DC | PRN

## 2019-05-17 MED ORDER — LORAZEPAM 0.5 MG PO TABS: 0.5000 mg | ORAL_TABLET | Freq: Every evening | ORAL | 0 refills | Status: AC | PRN

## 2019-05-17 MED ORDER — PROCHLORPERAZINE MALEATE 10 MG PO TABS: 10.0000 mg | ORAL_TABLET | Freq: Four times a day (QID) | ORAL | 1 refills | Status: AC | PRN

## 2019-05-17 MED ORDER — LIDOCAINE-PRILOCAINE 2.5-2.5 % EX CREA: TOPICAL_CREAM | CUTANEOUS | 3 refills | Status: DC

## 2019-05-17 MED ORDER — PROCHLORPERAZINE MALEATE 10 MG PO TABS: 10.0000 mg | ORAL_TABLET | Freq: Four times a day (QID) | ORAL | 1 refills | Status: DC | PRN

## 2019-05-17 NOTE — Telephone Encounter (Signed)
Patient notified of Dr.Perry's recommendations that it was okay to proceed. Also Lanelle Bal my Freight forwarder notified and states ok to proceed. Pt aware that if she has any drainage from the Shingles to call us back. Patient also request Suprep be sent into [facility]. This was resent per pt's request. Patient was VERY thankful!!

## 2019-05-17 NOTE — Telephone Encounter (Signed)
Patient notified

## 2019-05-17 NOTE — Telephone Encounter (Signed)
Discussed Dignicap with her and gave pamphlet along with next steps.  Patient will call and let us know her decision.

## 2019-05-17 NOTE — Telephone Encounter (Signed)
Pt is scheduled for a colon and reported that she has shingles on her back and is starting chemo on November 13.  She would like a call back to discuss.

## 2019-05-17 NOTE — Telephone Encounter (Signed)
Okay to proceed.  Thanks.

## 2019-05-17 NOTE — Assessment & Plan Note (Addendum)
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  05/09/2019: Reexcision: Benign  Pathology counseling: I discussed the final pathology report of the patient provided  a copy of this report. I discussed the margins as well as lymph node surgeries. We also discussed the final staging along with previously performed ER/PR and HER-2/neu testing.  Treatment plan: 1.  Adjuvant chemotherapy with Taxotere and Cytoxan every 3 weeks x6 cycles 2.  Adjuvant radiation therapy  Patient had second opinion at [facility] and they are recommending additional mammograms and possible biopsies on both her breasts.  This is happening first week of November.   Plan to start chemotherapy in 1 week.

## 2019-05-17 NOTE — Progress Notes (Signed)
START ON PATHWAY REGIMEN - Breast     A cycle is every 21 days:     Docetaxel      Cyclophosphamide   **Always confirm dose/schedule in your pharmacy ordering system**  Patient Characteristics: Postoperative without Neoadjuvant Therapy (Pathologic Staging), Invasive Disease, Adjuvant Therapy, HER2 Negative/Unknown/Equivocal, ER Negative/Unknown, Node Negative, pT1a-c, N1mi or pT1c or Higher, pN0 Therapeutic Status: Postoperative without Neoadjuvant Therapy (Pathologic Staging) AJCC Grade: G3 AJCC N Category: pN0 AJCC M Category: cM0 ER Status: Negative (-) AJCC 8 Stage Grouping: IB HER2 Status: Negative (-) Oncotype Dx Recurrence Score: Not Appropriate AJCC T Category: pT1c PR Status: Negative (-) Intent of Therapy: Curative Intent, Discussed with Patient 

## 2019-05-17 NOTE — Research (Signed)
05/17/2019 at 11:33am - Dr. Lindi Adie felt the pt was a good candidate for the "neuropathy study".  The research nurse met with the pt for 15 minutes going over the nature of the study as well as the study requirements/assessments at the various study timepoints.  The research nurse answered all of the pt's questions.  The pt is very eager to enroll in the study.  The research nurse will check the eligibility criteria for this potential pt.  The pt and nurse agreed to speak tomorrow about her study participation.  The pt showed the research nurse a "rash" that was bothering her.  The research nurse asked Dr. Lindi Adie to look at the pt's rash.  Dr. Lindi Adie said that he feels that the rash is shingles.  The pt said that the rash has been hurting her and itching a lot.  Dr. Lindi Adie said that he would prescribe the pt a week of Valtrex.  He believes that the pt will be over her shingles in 1 week.  The pt was thanked for her consideration of this trial. Brion Aliment RN, BSN, [place] Nurse 05/17/2019 11:40 AM   05/18/19 at 11:13am- The research nurse called the pt, and the pt said that she wanted to participate in the clinical trial.  The pt agreed to meet with the research nurse on Monday, 05/22/19 at 3 pm to discuss the consent form and answer any questions that she may have about study participation.  The pt was thanked for her interest in the study.  The pt is scheduled to begin her first treatment on 05/25/19.   Brion Aliment RN, BSN, CCRP Clinical Research Nurse 05/18/2019 11:14 AM

## 2019-05-17 NOTE — Telephone Encounter (Signed)
Spoke with the patient, she explained that she had a lumpectomy on 05/09/2019 and then she felt "itching on her upper shoulder/back area" she did not know what it was until today. She saw her oncologist and was told it was Shingles and was given Valtrex which she started today. Patient states the oncologist states it is "dried up" ,pt denies any drainage and said the size is less then 2 inches. Patient is going to begin chemo on 06/09/2019 and wants to have the colonoscopy at this time. Ok to proceed? Please advise. Thank you, Zahmir Lalla pv

## 2019-05-17 NOTE — Addendum Note (Signed)
Addended by: Nicholas Lose on: 05/17/2019 11:27 AM   Modules accepted: Orders

## 2019-05-18 ENCOUNTER — Telehealth: Payer: Self-pay | Admitting: Internal Medicine

## 2019-05-18 ENCOUNTER — Telehealth: Payer: Self-pay

## 2019-05-18 ENCOUNTER — Telehealth: Payer: Self-pay | Admitting: *Deleted

## 2019-05-18 ENCOUNTER — Telehealth: Payer: Self-pay | Admitting: Hematology and Oncology

## 2019-05-18 NOTE — Telephone Encounter (Signed)
Received message from patient that about her pathology.  She states when she went for her 2nd opinion at [facility] they wanted to retest the prognostics to see if she really is triple negative. This has put doubt in her mind and she is asking if our pathology lab will re-test these prognostics.  She really doesn't want to wait for [facility] to do so.  Called pathology and ordered a retest and informed her we should have the results before her treatment next week.

## 2019-05-18 NOTE — Telephone Encounter (Signed)
I talk with patient regarding schedule  

## 2019-05-18 NOTE — Telephone Encounter (Signed)
Instructed patient that she may take all her medications as directed. No further questions from the patient.

## 2019-05-18 NOTE — Telephone Encounter (Signed)
Covid-19 screening questions   Do you now or have you had a fever in the last 14 days? NO   Do you have any respiratory symptoms of shortness of breath or cough now or in the last 14 days? NO  Do you have any family members or close contacts with diagnosed or suspected Covid-19 in the past 14 days? NO  Have you been tested for Covid-19 and found to be positive? NO        

## 2019-05-18 NOTE — Telephone Encounter (Signed)
Pt is calling and has a colonoscopy tomorrow at 11:00 am. and she is taking an antibotic (amoxicillin for UTI) and taking Valtrex for the shingles that she found out she had- Asking if she should take these medicatoins tomorrow before procedure and also asking about her blood pressure medicine.

## 2019-05-19 ENCOUNTER — Ambulatory Visit (AMBULATORY_SURGERY_CENTER): Payer: Medicare Other | Admitting: Internal Medicine

## 2019-05-19 ENCOUNTER — Other Ambulatory Visit: Payer: Self-pay

## 2019-05-19 ENCOUNTER — Other Ambulatory Visit: Payer: Self-pay | Admitting: Internal Medicine

## 2019-05-19 ENCOUNTER — Encounter: Payer: Self-pay | Admitting: Internal Medicine

## 2019-05-19 VITALS — BP 143/72 | HR 58 | Temp 97.7°F | Resp 14 | Ht 65.0 in | Wt 193.6 lb

## 2019-05-19 DIAGNOSIS — Z86010 Personal history of colonic polyps: Secondary | ICD-10-CM | POA: Diagnosis present

## 2019-05-19 DIAGNOSIS — D122 Benign neoplasm of ascending colon: Secondary | ICD-10-CM

## 2019-05-19 DIAGNOSIS — K5140 Inflammatory polyps of colon without complications: Secondary | ICD-10-CM | POA: Diagnosis not present

## 2019-05-19 MED ORDER — SODIUM CHLORIDE 0.9 % IV SOLN: 500.0000 mL | Freq: Once | INTRAVENOUS | Status: AC

## 2019-05-19 NOTE — Op Note (Signed)
[facility] Patient Name: Laura Fisher Procedure Date: 05/19/2019 11:10 AM MRN: [id number] Endoscopist: Docia Chuck. Henrene Pastor , MD Age: 63 Referring MD:  Date of Birth: 07/30/1955 Gender: Female Account #: [id number] Procedure:                Colonoscopy cold snare polypectomy x 1 Indications:              High risk colon cancer surveillance: Personal                            history of colonic polyps. Multiple prior                            colonoscopies elsewhere ([place]). Was having                            colonoscopies about every 5 years. Was told to                            follow-up in 5 years from her last exam which was                            approximately 5.5 years ago. No further details Medicines:                Monitored Anesthesia Care Procedure:                Pre-Anesthesia Assessment:                           - Prior to the procedure, a History and Physical                            was performed, and patient medications and                            allergies were reviewed. The patient's tolerance of                            previous anesthesia was also reviewed. The risks                            and benefits of the procedure and the sedation                            options and risks were discussed with the patient.                            All questions were answered, and informed consent                            was obtained. Prior Anticoagulants: The patient has                            taken no previous anticoagulant or antiplatelet  agents. ASA Grade Assessment: II - A patient with                            mild systemic disease. After reviewing the risks                            and benefits, the patient was deemed in                            satisfactory condition to undergo the procedure.                           After obtaining informed consent, the colonoscope                            was  passed under direct vision. Throughout the                            procedure, the patient's blood pressure, pulse, and                            oxygen saturations were monitored continuously. The                            Colonoscope was introduced through the anus and                            advanced to the the cecum, identified by                            appendiceal orifice and ileocecal valve. The                            ileocecal valve, appendiceal orifice, and rectum                            were photographed. The quality of the bowel                            preparation was excellent. The colonoscopy was                            performed without difficulty. The patient tolerated                            the procedure well. The bowel preparation used was                            SUPREP via split dose instruction. Scope In: 11:20:11 AM Scope Out: 11:37:28 AM Scope Withdrawal Time: 0 hours 11 minutes 44 seconds  Total Procedure Duration: 0 hours 17 minutes 17 seconds  Findings:                 A 5 mm polyp was found in the ascending colon.  The                            polyp was pedunculated. The polyp was removed with                            a cold snare. Resection and retrieval were complete.                           Multiple small and large-mouthed diverticula were                            found in the entire colon.                           Internal hemorrhoids were found during retroflexion.                           The exam was otherwise without abnormality on                            direct and retroflexion views. Complications:            No immediate complications. Estimated blood loss:                            None. Estimated Blood Loss:     Estimated blood loss: none. Impression:               - One 5 mm polyp in the ascending colon, removed                            with a cold snare. Resected and retrieved.                            - Diverticulosis in the entire examined colon.                           - Internal hemorrhoids.                           - The examination was otherwise normal on direct                            and retroflexion views. Recommendation:           - Repeat colonoscopy in 10 years for surveillance.                           - Patient has a contact number available for                            emergencies. The signs and symptoms of potential                            delayed complications were discussed with the  patient. Return to normal activities tomorrow.                            Written discharge instructions were provided to the                            patient.                           - Resume previous diet.                           - Continue present medications.                           - Await pathology results. Docia Chuck. Henrene Pastor, MD 05/19/2019 11:48:08 AM This report has been signed electronically.

## 2019-05-19 NOTE — Progress Notes (Signed)
PT taken to PACU. Monitors in place. VSS. Report given to RN. 

## 2019-05-19 NOTE — Progress Notes (Signed)
Called to room to assist during endoscopic procedure.  Patient ID and intended procedure confirmed with present staff. Received instructions for my participation in the procedure from the performing physician.  

## 2019-05-19 NOTE — Patient Instructions (Signed)
HANDOUTS PROVIDED ON:  POLYPS, DIVERTICULOSIS, & HEMORRHOIDS  THE POLYPS REMOVED TODAY HAVE BEEN SENT FOR PATHOLOGY.  THE RESULTS CAN TAKE 2-3 WEEKS TO RECEIVE.  YOUR NEXT COLONOSCOPY SHOULD BE IN 10 YEARS.    YOU MAY RESUME YOUR PREVIOUS DIET AND MEDICATION SCHEDULE.  [place] YOU FOR ALLOWING [place] TO CARE FOR YOU TODAY!!  YOU HAD AN ENDOSCOPIC PROCEDURE TODAY AT [facility]:   Refer to the procedure report that was given to you for any specific questions about what was found during the examination.  If the procedure report does not answer your questions, please call your gastroenterologist to clarify.  If you requested that your care partner not be given the details of your procedure findings, then the procedure report has been included in a sealed envelope for you to review at your convenience later.  YOU SHOULD EXPECT: Some feelings of bloating in the abdomen. Passage of more gas than usual.  Walking can help get rid of the air that was put into your GI tract during the procedure and reduce the bloating. If you had a lower endoscopy (such as a colonoscopy or flexible sigmoidoscopy) you may notice spotting of blood in your stool or on the toilet paper. If you underwent a bowel prep for your procedure, you may not have a normal bowel movement for a few days.  Please Note:  You might notice some irritation and congestion in your nose or some drainage.  This is from the oxygen used during your procedure.  There is no need for concern and it should clear up in a day or so.  SYMPTOMS TO REPORT IMMEDIATELY:   Following lower endoscopy (colonoscopy or flexible sigmoidoscopy):  Excessive amounts of blood in the stool  Significant tenderness or worsening of abdominal pains  Swelling of the abdomen that is new, acute  Fever of 100F or higher   For urgent or emergent issues, a gastroenterologist can be reached at any hour by calling [phone].   DIET:  We do recommend a small  meal at first, but then you may proceed to your regular diet.  Drink plenty of fluids but you should avoid alcoholic beverages for 24 hours.  ACTIVITY:  You should plan to take it easy for the rest of today and you should NOT DRIVE or use heavy machinery until tomorrow (because of the sedation medicines used during the test).    FOLLOW UP: Our staff will call the number listed on your records 48-72 hours following your procedure to check on you and address any questions or concerns that you may have regarding the information given to you following your procedure. If we do not reach you, we will leave a message.  We will attempt to reach you two times.  During this call, we will ask if you have developed any symptoms of COVID 19. If you develop any symptoms (ie: fever, flu-like symptoms, shortness of breath, cough etc.) before then, please call [phone].  If you test positive for Covid 19 in the 2 weeks post procedure, please call and report this information to [place].    If any biopsies were taken you will be contacted by phone or by letter within the next 1-3 weeks.  Please call us at [phone] if you have not heard about the biopsies in 3 weeks.    SIGNATURES/CONFIDENTIALITY: You and/or your care partner have signed paperwork which will be entered into your electronic medical record.  These signatures attest to the  fact that that the information above on your After Visit Summary has been reviewed and is understood.  Full responsibility of the confidentiality of this discharge information lies with you and/or your care-partner.

## 2019-05-19 NOTE — Progress Notes (Addendum)
[facility] - temp CW - VS   Only change since pre-visit was pt was dx with left sided breast cancer.  Pt had lumpectomy 04-27-2019 and 05-09-2019 re-excision of left breast Lumpectomy.  Pt reported she has a UTI.  Started with sx this past Sunday.  Was seen at [facility] on Monday and taking Amoxicillin 875 mg BID.  Which she took this am.  Laura Fisher

## 2019-05-22 ENCOUNTER — Ambulatory Visit: Payer: Medicare Other | Attending: General Surgery | Admitting: Physical Therapy

## 2019-05-22 ENCOUNTER — Other Ambulatory Visit: Payer: Self-pay

## 2019-05-22 ENCOUNTER — Telehealth: Payer: Self-pay | Admitting: *Deleted

## 2019-05-22 ENCOUNTER — Encounter: Payer: Self-pay | Admitting: Physical Therapy

## 2019-05-22 ENCOUNTER — Encounter: Payer: Medicare Other | Admitting: *Deleted

## 2019-05-22 DIAGNOSIS — R293 Abnormal posture: Secondary | ICD-10-CM | POA: Insufficient documentation

## 2019-05-22 DIAGNOSIS — Z171 Estrogen receptor negative status [ER-]: Secondary | ICD-10-CM | POA: Insufficient documentation

## 2019-05-22 DIAGNOSIS — Z483 Aftercare following surgery for neoplasm: Secondary | ICD-10-CM | POA: Diagnosis present

## 2019-05-22 DIAGNOSIS — C50212 Malignant neoplasm of upper-inner quadrant of left female breast: Secondary | ICD-10-CM

## 2019-05-22 NOTE — Therapy (Signed)
[place], [place], [address] Phone: [phone]   Fax:  [phone]  Physical Therapy Treatment  Patient Details  Name: Laura Fisher MRN: [id number] Date of Birth: Dec 17, 1955 Referring Provider (PT): Dr. Stark Klein   Encounter Date: 05/22/2019  PT End of Session - 05/22/19 1630    Visit Number  2    Number of Visits  2    PT Start Time  1400    PT Stop Time  1458    PT Time Calculation (min)  58 min    Activity Tolerance  Patient tolerated treatment well    Behavior During Therapy  [facility] for tasks assessed/performed       Past Medical History:  Diagnosis Date  . Allergy    seasonal  . Anal fissure   . Arthritis    right knee  . Cancer [facility])    breast left side dx 04/14/19  . Colon polyp   . Complication of anesthesia    Family history of pseudocholinesterase deficiency  . Family history of adverse reaction to anesthesia    pt's mother had pseudocholinesterase deficiency  . Family history of breast cancer   . Family history of leukemia   . Family history of pancreatic cancer   . Family history of pseudocholinesterase deficiency    mother  . Family history of stomach cancer   . Fatty liver   . GERD (gastroesophageal reflux disease)   . Heart murmur    benign per pt  . High blood pressure   . History of IBS   . Obesity   . Osteopenia   . PONV (postoperative nausea and vomiting)    "terrible nausea" with a D&C for a miscarriage and after T&A  . Pre-diabetes   . Sleep apnea    use a oral device    Past Surgical History:  Procedure Laterality Date  . BREAST LUMPECTOMY WITH RADIOACTIVE SEED AND SENTINEL LYMPH NODE BIOPSY Left 04/27/2019   Procedure: LEFT BREAST LUMPECTOMY WITH RADIOACTIVE SEED AND SENTINEL LYMPH NODE BIOPSY;  Surgeon: Stark Klein, MD;  Location: [place];  Service: General;  Laterality: Left;  . COLONOSCOPY  2015  . POLYPECTOMY    . PORTACATH PLACEMENT  Right 04/27/2019   Procedure: INSERTION PORT-A-CATH WITH ULTRASOUND;  Surgeon: Stark Klein, MD;  Location: [place];  Service: General;  Laterality: Right;  . RE-EXCISION OF BREAST LUMPECTOMY Left 05/09/2019   Procedure: RE-EXCISION OF LEFT BREAST LUMPECTOMY;  Surgeon: Stark Klein, MD;  Location: [place];  Service: General;  Laterality: Left;  . TONSILECTOMY, ADENOIDECTOMY, BILATERAL MYRINGOTOMY AND TUBES     never had tubes in ears  . WISDOM TOOTH EXTRACTION    . WRIST SURGERY Bilateral     There were no vitals filed for this visit.  Subjective Assessment - 05/22/19 1618    Subjective  Patient reports she had a left lumpectomy and sentinel node biopsy (0/3 nodes positive) on 04/27/2019. She had a re-excision on 05/09/2019. Cancer is triple negative so she will begin chemotherapy on 05/25/2019. She got a second opinion at [facility] which concurred with [place]'s recommendations. She then got a 3rd opinion at [facility] which is recommending another mammogram and additional biopsies for areas seen in bilateral breasts. She had many questions and concerns today but PT listened and recommended only that she trust her team. She said she was advised by her oncologist to move forward with chemotherapy and that she could still see  the physician at [facility] even if she started chemo. I told her I agreed with his recommendation to avoid delaying chemotherapy.    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 It has a Ki67 of 40%. She had a left lumpectomy and sentinel node biopsy on 04/27/2019 (0/3 nodes positive) and then a re-excision on 05/09/2019.    Patient Stated Goals  Make sure my arm is ok    Currently in Pain?  No/denies         [facility] PT Assessment - 05/22/19 0001      Assessment   Medical Diagnosis  s/p left lumpectomy and SLNB    Referring Provider (PT)  Dr. Stark Klein    Onset Date/Surgical Date   04/27/19    Hand Dominance  Right    Prior Therapy  Baselines      Precautions   Precautions  Other (comment)    Precaution Comments  recent surgery and left arm lymphedema risk      Restrictions   Weight Bearing Restrictions  No      Balance Screen   Has the patient fallen in the past 6 months  No    Has the patient had a decrease in activity level because of a fear of falling?   No    Is the patient reluctant to leave their home because of a fear of falling?   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 There appears to be a small stitch present in her axilla and she was encouraged to ask her surgeon about that.      Posture/Postural Control   Posture/Postural Control  Postural limitations    Postural Limitations  Rounded Shoulders;Forward head      ROM / Strength   AROM / PROM / Strength  AROM      AROM   AROM Assessment Site  Shoulder    Right/Left Shoulder  Left    Left Shoulder Extension  55 Degrees    Left Shoulder Flexion  153 Degrees    Left Shoulder ABduction  160 Degrees    Left Shoulder Internal Rotation  71 Degrees    Left Shoulder External Rotation  82 Degrees           Quick Dash - 05/22/19 0001    Open a tight or new jar  Severe difficulty    Do heavy household chores (wash walls, wash floors)  Mild difficulty    Carry a shopping bag or briefcase  No difficulty    Wash your back  No difficulty    Use a knife to cut food  No difficulty     Recreational activities in which you take some force or impact through your arm, shoulder, or hand (golf, hammering, tennis)  Mild difficulty  During the past week, to what extent has your arm, shoulder or hand problem interfered with your normal social activities with family, friends, neighbors, or groups?  Not at all    During the past week, to what extent has your arm, shoulder or hand problem limited your work or other regular daily activities  Not at all    Arm, shoulder, or hand pain.  Mild    Tingling (pins and needles) in your arm, shoulder, or hand  None    Difficulty Sleeping  No difficulty    DASH Score  13.64 %                     PT Education - 05/22/19 1630    Education Details  ABC class information and HEP    Person(s) Educated  Patient    Methods  Explanation    Comprehension  Verbalized understanding          PT Long Term Goals - 05/22/19 1635      PT LONG TERM GOAL #1   Title  Patient will demonstrate she has regained full shoulder ROM and function post operatively compared to baselines.    Time  8    Period  Weeks    Status  Achieved            Plan - 05/22/19 1630    Clinical Impression Statement  Patient is physically doing very well s.p left breast lumpectomy and sentinel node biopsy. She has received 2nd and 3rd opinions at [facility] (who concurred with [facility]) and with [facility] who requested additional testing. She is scheduled to begin chemotherapy on 05/25/2019 as it is triple negative but is questioning whether she should move forward with that knowing she may have other areas of concern in both breasts (per [facility]). She was encouraged to discuss this with her surgeon who she sees later today. Her shoulder ROM is back to baseline and there is no sign of lymphedema. Incisions appear to be healing well. She will participate in the After Breast Cancer class but otherwise has no PT needs at this time.    PT Treatment/Interventions  ADLs/Self Care  Home Management;Therapeutic exercise;Patient/family education    PT Next Visit Plan  D/C    PT Home Exercise Plan  Post op shoulder ROM HEP    Consulted and Agree with Plan of Care  Patient       Patient will benefit from skilled therapeutic intervention in order to improve the following deficits and impairments:  Postural dysfunction, Decreased knowledge of precautions, Pain, Impaired UE functional use, Decreased range of motion  Visit Diagnosis: Malignant neoplasm of upper-inner quadrant of left breast in female, estrogen receptor negative (HCC)  Abnormal posture  Aftercare following surgery for neoplasm     Problem List Patient Active Problem List   Diagnosis Date Noted  . Genetic testing 05/12/2019  . Family history of breast cancer   . Family history of pancreatic cancer   . Family history of stomach cancer   . Family history of leukemia   . Malignant neoplasm of upper-inner quadrant of left breast in female, estrogen receptor negative ([place]) 04/18/2019  . Moderate obstructive sleep apnea 03/02/2016  . [place] 10/14/2015   PHYSICAL THERAPY DISCHARGE SUMMARY  Visits from Start of Care: 2  Current functional level related to goals / functional outcomes: Goals met; see above for objective findings.   Remaining deficits: None   Education / Equipment: HEP and lymphedema risk reduction  Plan: Patient agrees to discharge.  Patient goals were met. Patient is being discharged due to meeting the stated rehab goals.  ?????         Annia Friendly, [place] 05/22/19 4:36 PM  [place], [place], [address] Phone: [phone]   Fax:  [phone]  Name: Talasia Saulter MRN: [id number] Date of Birth: Jul 18, 1956

## 2019-05-22 NOTE — Telephone Encounter (Signed)
05/22/2019 - at 2:26pm - The pt called the research nurse and stated that her appts were going to be longer than she had anticipated this afternoon.  She said that she could not make the consent visit appt today at 3pm.  The pt apologized for the late call, but she said that she tried to contact the research nurse on Friday 10/23 about her cancellation.  The pt said that does not think she will be able to participate on the "neuropathy study".  The pt said that she would call the nurse back if she changes her mind about her participation.  The pt was thanked for her interest and for reading about the study.  The pt's first treatment is scheduled for this Thursday, 10/29. Brion Aliment RN, BSN. CCRP Clinical Research Nurse 05/22/2019 2:29 PM

## 2019-05-23 ENCOUNTER — Telehealth: Payer: Self-pay

## 2019-05-23 ENCOUNTER — Telehealth: Payer: Self-pay | Admitting: *Deleted

## 2019-05-23 ENCOUNTER — Other Ambulatory Visit: Payer: Self-pay

## 2019-05-23 ENCOUNTER — Inpatient Hospital Stay: Payer: Medicare Other

## 2019-05-23 DIAGNOSIS — C50212 Malignant neoplasm of upper-inner quadrant of left female breast: Secondary | ICD-10-CM | POA: Diagnosis not present

## 2019-05-23 DIAGNOSIS — Z95828 Presence of other vascular implants and grafts: Secondary | ICD-10-CM

## 2019-05-23 MED ORDER — HEPARIN SOD (PORK) LOCK FLUSH 100 UNIT/ML IV SOLN
500.0000 [IU] | Freq: Once | INTRAVENOUS | Status: AC
  Administered 2019-05-23: 500 [IU] via INTRAVENOUS
  Filled 2019-05-23: qty 5

## 2019-05-23 MED ORDER — SODIUM CHLORIDE 0.9% FLUSH
10.0000 mL | INTRAVENOUS | Status: DC | PRN
  Administered 2019-05-23: 10 mL via INTRAVENOUS
  Filled 2019-05-23: qty 10

## 2019-05-23 NOTE — Telephone Encounter (Signed)
  Follow up Call-  Call back number 05/19/2019  Post procedure Call Back phone  # #[phone] hm  Permission to leave phone message Yes     Patient questions:  Do you have a fever, pain , or abdominal swelling? No. Pain Score  0 *  Have you tolerated food without any problems? Yes.    Have you been able to return to your normal activities? Yes.    Do you have any questions about your discharge instructions: Diet   No. Medications  No. Follow up visit  No.  Do you have questions or concerns about your Care? No.  Actions: * If pain score is 4 or above: No action needed, pain <4.  1. Have you developed a fever since your procedure? no  2.   Have you had an respiratory symptoms (SOB or cough) since your procedure? no  3.   Have you tested positive for COVID 19 since your procedure no  4.   Have you had any family members/close contacts diagnosed with the COVID 19 since your procedure? no   If yes to any of these questions please route to Joylene John, RN and Alphonsa Gin, Therapist, sports.

## 2019-05-23 NOTE — Telephone Encounter (Signed)
  Follow up Call-  Call back number 05/19/2019  Post procedure Call Back phone  # #[phone] hm  Permission to leave phone message Yes     Patient questions:  Do you have a fever, pain , or abdominal swelling? No. Pain Score  0 *  Have you tolerated food without any problems? Yes.    Have you been able to return to your normal activities? Yes.    Do you have any questions about your discharge instructions: Diet   No. Medications  No. Follow up visit  No.  Do you have questions or concerns about your Care? No.  Actions: * If pain score is 4 or above: No action needed, pain <4.  1. Have you developed a fever since your procedure? no  2.   Have you had an respiratory symptoms (SOB or cough) since your procedure? no  3.   Have you tested positive for COVID 19 since your procedure no  4.   Have you had any family members/close contacts diagnosed with the COVID 19 since your procedure?  no   If yes to any of these questions please route to Joylene John, RN and Alphonsa Gin, Therapist, sports.

## 2019-05-23 NOTE — Telephone Encounter (Signed)
Pt requested to cancel upcoming appointments due to obtaining a second opinion at [facility].    Pt reviewed with Dr. Barry Dienes prior and received recommendations to have second opinion prior to chemotherapy.  Pt is scheduled at [facility] on 11/9.  Pt will contact office once second opinion if anything additional is needed from [place].

## 2019-05-24 ENCOUNTER — Encounter: Payer: Self-pay | Admitting: Internal Medicine

## 2019-05-25 ENCOUNTER — Other Ambulatory Visit: Payer: Medicare Other

## 2019-05-25 ENCOUNTER — Ambulatory Visit: Payer: Medicare Other

## 2019-05-25 ENCOUNTER — Ambulatory Visit: Payer: Medicare Other | Admitting: Hematology and Oncology

## 2019-05-30 LAB — SURGICAL PATHOLOGY

## 2019-06-01 ENCOUNTER — Other Ambulatory Visit: Payer: Medicare Other

## 2019-06-01 ENCOUNTER — Ambulatory Visit: Payer: Medicare Other | Admitting: Hematology and Oncology

## 2019-06-07 ENCOUNTER — Telehealth: Payer: Self-pay | Admitting: *Deleted

## 2019-06-07 NOTE — Telephone Encounter (Signed)
Spoke with patient today to clarify about her treatment plans.  She is currently being treated with chemotherapy at [facility].  She states one of the main reasons for choosing [facility] over the cancer center here is that she was able to get the cold cap at a significantly reduced rate due to some studies that they are doing.  She is unsure about where she will receive her radiation therapy.  She said she would let us know when she has made a decision. Encouraged her to call with any needs or concerns.

## 2019-06-08 ENCOUNTER — Telehealth: Payer: Self-pay | Admitting: *Deleted

## 2019-06-08 NOTE — Telephone Encounter (Signed)
06/08/19 at 12:20pm DCP-001 - The pt informed the [facility] that she will be receiving her chemo at [facility] on 06/07/19.  Therefore, the pt will not enroll in the S1714 trial.  The pt was contacted by the research nurse about the DCP-001 since she was approached for a [employer]-sponsored trial, and she was eligible for study enrollment. The pt agreed to read over the consent form and decide whether or not she is comfortable sharing her personal information for the study.  The research nurse answered numerous questions about the study.  The research nurse will email the pt the consent form, hipaa form, and the DCP-001 worksheet to the pt for the pt to read at her convenience.  The pt verified her email address to the nurse.  The pt was thanked for her willingness to read the consent form.  The pt was told that participation in the DCP-001 study is voluntary.  The pt said that she would let the nurse know her decision.  If the pt is interested, then the pt and nurse agreed to conduct the consent process over the phone.   Brion Aliment RN, BSN, CCRP Clinical Research Nurse 06/08/2019 12:24 PM

## 2019-06-15 ENCOUNTER — Other Ambulatory Visit: Payer: Medicare Other

## 2019-06-15 ENCOUNTER — Ambulatory Visit: Payer: Medicare Other | Admitting: Adult Health

## 2019-06-15 ENCOUNTER — Ambulatory Visit: Payer: Medicare Other

## 2019-06-17 ENCOUNTER — Ambulatory Visit: Payer: Medicare Other

## 2019-06-29 ENCOUNTER — Telehealth: Payer: Self-pay | Admitting: *Deleted

## 2019-06-29 NOTE — Telephone Encounter (Signed)
06/29/19 at 10:58am - The research nurse called the pt to ask her if she had read the DCP-001 consent form and hipaa form.  The pt apologized and stated that she has been too busy with her treatments to read the consent form.  The nurse asked the pt if she had any time today to read over the consent form and decide about her participation.  The pt said that she did not have time to read the consent, and she would need to decline participation due to her time constraints.  The pt was thanked for her time.   She was informed that she will mark that the pt does not want to participate in the DCP-001 study.  The pt confirmed that she wanted to mark her answer as a "no".  The pt is on her 2nd cycle of chemo at [facility], and nurse wished the pt well with her future treatments.  The pt was told that if she has any questions then she can contact the research nurse. Brion Aliment RN, BSN, CCRP Clinical Research Nurse 06/29/2019 11:03 AM

## 2019-07-06 ENCOUNTER — Other Ambulatory Visit: Payer: Medicare Other

## 2019-07-06 ENCOUNTER — Ambulatory Visit: Payer: Medicare Other | Admitting: Hematology and Oncology

## 2019-07-06 ENCOUNTER — Ambulatory Visit: Payer: Medicare Other

## 2019-07-08 ENCOUNTER — Ambulatory Visit: Payer: Medicare Other

## 2019-09-25 ENCOUNTER — Ambulatory Visit: Payer: Medicare Other | Attending: Internal Medicine

## 2019-09-25 DIAGNOSIS — Z23 Encounter for immunization: Secondary | ICD-10-CM

## 2019-09-25 NOTE — Progress Notes (Signed)
   Covid-19 Vaccination Clinic  Name:  Laura Fisher    MRN: [id number] DOB: 11-26-1955  09/25/2019  [name] was observed post Covid-19 immunization for 15 minutes without incidence. She was provided with Vaccine Information Sheet and instruction to access the V-Safe system.   [name] was instructed to call 911 with any severe reactions post vaccine: Marland Kitchen Difficulty breathing  . Swelling of your face and throat  . A fast heartbeat  . A bad rash all over your body  . Dizziness and weakness    Immunizations Administered    Name Date Dose VIS Date Route   Pfizer COVID-19 Vaccine 09/25/2019  9:26 AM 0.3 mL 07/07/2019 Intramuscular   Manufacturer: [place]   Lot: [id number]   [place]: [id number]

## 2019-10-17 ENCOUNTER — Encounter: Payer: Self-pay | Admitting: Family Medicine

## 2019-10-17 ENCOUNTER — Ambulatory Visit (INDEPENDENT_AMBULATORY_CARE_PROVIDER_SITE_OTHER): Payer: Medicare Other | Admitting: Family Medicine

## 2019-10-17 ENCOUNTER — Ambulatory Visit: Payer: Medicare Other | Attending: Internal Medicine

## 2019-10-17 ENCOUNTER — Other Ambulatory Visit: Payer: Self-pay

## 2019-10-17 DIAGNOSIS — Z23 Encounter for immunization: Secondary | ICD-10-CM

## 2019-10-17 DIAGNOSIS — M25561 Pain in right knee: Secondary | ICD-10-CM | POA: Diagnosis not present

## 2019-10-17 NOTE — Patient Instructions (Signed)
    1. B-Complex vitamin once daily 2.  Alpha Lipoic Acid 600 mg twice daily 3.  N-Acetyl Cysteine 600 mg twice daily

## 2019-10-17 NOTE — Progress Notes (Signed)
Office Visit Note   Patient: Laura Fisher           Date of Birth: 1956/01/08           MRN: [id number] Visit Date: 10/17/2019 Requested by: Laura Rad, MD [facility] [address] [place] [address] PCP: Laura Rad, MD  Subjective: Chief Complaint  Patient presents with  . Right Knee - Pain    Been walking 2-3&1/2 miles daily, to get stamina back (taking radiation for breast cancer, & had chemo - [name]). Pain started in the knee again this week (anterior). Had added hills to her walk around 2 weeks ago.    HPI: She is here with right knee pain.  She has a history of osteoarthritis.  She was doing well after last visit, physical therapy and nutritional supplements were healthy.  Then last year she was diagnosed with early stage breast cancer.  She underwent chemotherapy and is now doing radiation.  She has been walking for exercise to build up her stamina.  She was up to 3-1/2 miles daily and then started walking hills, and her knee started hurting about 2 weeks ago.  Pain is mostly on the anterior medial aspect.  No locking or giving way.  During chemotherapy she was told to stop all of her vitamins and supplements.  She remains off them still.  She had a bone density test recently and does not know the results yet.               ROS:   All other systems were reviewed and are negative.  Objective: Vital Signs: There were no vitals taken for this visit.  Physical Exam:  General:  Alert and oriented, in no acute distress. Pulm:  Breathing unlabored. Psy:  Normal mood, congruent affect.  Right knee: Trace effusion with no warmth.  She has some tenderness around the patellofemoral joint and the medial joint line.  Full active extension, flexion of 120.  Imaging: None today  Assessment & Plan: 1.  Flareup of right knee pain with known history of osteoarthritis, probably aggravated by walking hills. -She will resume physical  therapy.  Resume her bone building vitamins. -She is starting to have some neuropathy symptoms as well, so I gave her some suggestions for treatment of that. -If the knee pain worsens, could consider a one-time cortisone injection.     Procedures: No procedures performed  No notes on file     PMFS History: Patient Active Problem List   Diagnosis Date Noted  . Genetic testing 05/12/2019  . Family history of breast cancer   . Family history of pancreatic cancer   . Family history of stomach cancer   . Family history of leukemia   . Malignant neoplasm of upper-inner quadrant of left breast in female, estrogen receptor negative ([place]) 04/18/2019  . Moderate obstructive sleep apnea 03/02/2016  . Dr[place]en 10/14/2015   Past Medical History:  Diagnosis Date  . Allergy    seasonal  . Anal fissure   . Arthritis    right knee  . Cancer [facility])    breast left side dx 04/14/19  . Colon polyp   . Complication of anesthesia    Family history of pseudocholinesterase deficiency  . Family history of adverse reaction to anesthesia    pt's mother had pseudocholinesterase deficiency  . Family history of breast cancer   . Family history of leukemia   . Family history of pancreatic cancer   .  Family history of pseudocholinesterase deficiency    mother  . Family history of stomach cancer   . Fatty liver   . GERD (gastroesophageal reflux disease)   . Heart murmur    benign per pt  . High blood pressure   . History of IBS   . Obesity   . Osteopenia   . PONV (postoperative nausea and vomiting)    "terrible nausea" with a D&C for a miscarriage and after T&A  . Pre-diabetes   . Sleep apnea    use a oral device    Family History  Problem Relation Age of Onset  . Tongue cancer Mother 55  . Heart disease Mother   . Heart disease Father   . Diabetes Sister   . Pancreatic cancer Other        Nephew  . Stomach cancer Maternal Aunt   . Leukemia Paternal Aunt   . Breast cancer Cousin 37  .  Cancer Cousin        unk type  . Colon cancer Neg Hx   . Colon polyps Neg Hx   . Esophageal cancer Neg Hx   . Rectal cancer Neg Hx     Past Surgical History:  Procedure Laterality Date  . BREAST LUMPECTOMY WITH RADIOACTIVE SEED AND SENTINEL LYMPH NODE BIOPSY Left 04/27/2019   Procedure: LEFT BREAST LUMPECTOMY WITH RADIOACTIVE SEED AND SENTINEL LYMPH NODE BIOPSY;  Surgeon: Stark Klein, MD;  Location: [place];  Service: General;  Laterality: Left;  . COLONOSCOPY  2015  . POLYPECTOMY    . PORTACATH PLACEMENT Right 04/27/2019   Procedure: INSERTION PORT-A-CATH WITH ULTRASOUND;  Surgeon: Stark Klein, MD;  Location: [place];  Service: General;  Laterality: Right;  . RE-EXCISION OF BREAST LUMPECTOMY Left 05/09/2019   Procedure: RE-EXCISION OF LEFT BREAST LUMPECTOMY;  Surgeon: Stark Klein, MD;  Location: [place];  Service: General;  Laterality: Left;  . TONSILECTOMY, ADENOIDECTOMY, BILATERAL MYRINGOTOMY AND TUBES     never had tubes in ears  . WISDOM TOOTH EXTRACTION    . WRIST SURGERY Bilateral    Social History   Occupational History  . Occupation: Art therapist  Tobacco Use  . Smoking status: Never Smoker  . Smokeless tobacco: Never Used  Substance and Sexual Activity  . Alcohol use: Never  . Drug use: Never  . Sexual activity: Yes    Partners: Male

## 2019-10-17 NOTE — Progress Notes (Signed)
   Covid-19 Vaccination Clinic  Name:  Laura Fisher    MRN: [id number] DOB: 1955-12-06  10/17/2019  [name] was observed post Covid-19 immunization for 15 minutes without incident. She was provided with Vaccine Information Sheet and instruction to access the V-Safe system.   [name] was instructed to call 911 with any severe reactions post vaccine: Marland Kitchen Difficulty breathing  . Swelling of face and throat  . A fast heartbeat  . A bad rash all over body  . Dizziness and weakness   Immunizations Administered    Name Date Dose VIS Date Route   Pfizer COVID-19 Vaccine 10/17/2019 11:40 AM 0.3 mL 07/07/2019 Intramuscular   Manufacturer: [place]   Lot: [id number]   [place]: [id number]

## 2019-10-19 ENCOUNTER — Ambulatory Visit: Payer: Medicare Other | Admitting: Family Medicine

## 2019-10-24 ENCOUNTER — Ambulatory Visit: Payer: Medicare Other

## 2019-11-17 ENCOUNTER — Ambulatory Visit: Payer: Medicare Other | Admitting: Family

## 2020-02-08 ENCOUNTER — Other Ambulatory Visit: Payer: Self-pay

## 2020-02-08 ENCOUNTER — Ambulatory Visit: Payer: Medicare Other | Attending: General Surgery | Admitting: Physical Therapy

## 2020-02-08 ENCOUNTER — Encounter: Payer: Self-pay | Admitting: Physical Therapy

## 2020-02-08 DIAGNOSIS — Z483 Aftercare following surgery for neoplasm: Secondary | ICD-10-CM | POA: Insufficient documentation

## 2020-02-08 DIAGNOSIS — I890 Lymphedema, not elsewhere classified: Secondary | ICD-10-CM | POA: Diagnosis not present

## 2020-02-08 DIAGNOSIS — R293 Abnormal posture: Secondary | ICD-10-CM | POA: Insufficient documentation

## 2020-02-08 NOTE — Therapy (Signed)
[place], [place], [address] Phone: [phone]   Fax:  [phone]  Physical Therapy Evaluation  Patient Details  Name: Laura Fisher MRN: [id number] Date of Birth: [birth year] Referring Provider (PT): Dr. Stark Klein   Encounter Date: 02/08/2020   PT End of Session - 02/08/20 1600    Visit Number 1    Number of Visits 13    Date for PT Re-Evaluation 03/21/20    PT Start Time 0354    PT Stop Time 1355    PT Time Calculation (min) 47 min    Activity Tolerance Patient tolerated treatment well    Behavior During Therapy [facility] for tasks assessed/performed           Past Medical History:  Diagnosis Date  . Allergy    seasonal  . Anal fissure   . Arthritis    right knee  . Cancer [facility])    breast left side dx 04/14/19  . Colon polyp   . Complication of anesthesia    Family history of pseudocholinesterase deficiency  . Family history of adverse reaction to anesthesia    pt's mother had pseudocholinesterase deficiency  . Family history of breast cancer   . Family history of leukemia   . Family history of pancreatic cancer   . Family history of pseudocholinesterase deficiency    mother  . Family history of stomach cancer   . Fatty liver   . GERD (gastroesophageal reflux disease)   . Heart murmur    benign per pt  . High blood pressure   . History of IBS   . Obesity   . Osteopenia   . PONV (postoperative nausea and vomiting)    "terrible nausea" with a D&C for a miscarriage and after T&A  . Pre-diabetes   . Sleep apnea    use a oral device    Past Surgical History:  Procedure Laterality Date  . BREAST LUMPECTOMY WITH RADIOACTIVE SEED AND SENTINEL LYMPH NODE BIOPSY Left 04/27/2019   Procedure: LEFT BREAST LUMPECTOMY WITH RADIOACTIVE SEED AND SENTINEL LYMPH NODE BIOPSY;  Surgeon: Stark Klein, MD;  Location: [place];  Service: General;  Laterality: Left;  . COLONOSCOPY  2015  .  POLYPECTOMY    . PORTACATH PLACEMENT Right 04/27/2019   Procedure: INSERTION PORT-A-CATH WITH ULTRASOUND;  Surgeon: Stark Klein, MD;  Location: [place];  Service: General;  Laterality: Right;  . RE-EXCISION OF BREAST LUMPECTOMY Left 05/09/2019   Procedure: RE-EXCISION OF LEFT BREAST LUMPECTOMY;  Surgeon: Stark Klein, MD;  Location: [place];  Service: General;  Laterality: Left;  . TONSILECTOMY, ADENOIDECTOMY, BILATERAL MYRINGOTOMY AND TUBES     never had tubes in ears  . WISDOM TOOTH EXTRACTION    . WRIST SURGERY Bilateral     There were no vitals filed for this visit.    Subjective Assessment - 02/08/20 1309    Subjective Finished radiation in April 2020 and had lumpectomy and SLNB on 04/27/19. I went back to Dr. Barry Dienes this week and she thinks I have lymphedema in the breast. I have been having some discomfort.    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 It has a Ki67 of 40%. She had a left lumpectomy and sentinel node biopsy on 04/27/2019 (0/3 nodes positive) and then a re-excision on 05/09/2019.    Patient Stated Goals to get swelling down    Currently in Pain?  Yes    Pain Score 1     Pain Location Breast    Pain Orientation Left    Pain Descriptors / Indicators Dull;Discomfort    Pain Type Acute pain;Surgical pain    Pain Radiating Towards n/a    Pain Onset More than a month ago    Pain Frequency Intermittent    Aggravating Factors  nothing    Pain Relieving Factors nothing    Effect of Pain on Daily Activities none              [facility] PT Assessment - 02/08/20 0001      Assessment   Medical Diagnosis s/p left lumpectomy and SLNB    Referring Provider (PT) Dr. Stark Klein    Onset Date/Surgical Date 04/27/19    Hand Dominance Right    Prior Therapy pre op and post op at this clinic      Precautions   Precautions Other (comment)    Precaution Comments at risk of  lymphedema      Restrictions   Weight Bearing Restrictions No      Balance Screen   Has the patient fallen in the past 6 months No    Has the patient had a decrease in activity level because of a fear of falling?  No    Is the patient reluctant to leave their home because of a fear of falling?  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      Cognition   Overall Cognitive Status Within Functional Limits for tasks assessed      Observation/Other Assessments   Observations L breast slightly larger than right per visual appearance, some fluid visible just superior to lumpectomy scar      Posture/Postural Control   Posture/Postural Control Postural limitations    Postural Limitations Rounded Shoulders;Forward head      AROM   Overall AROM Comments bilateral shoulder ROM WFL but some tightness in L axilla    Left Shoulder Extension --    Left Shoulder Flexion --    Left Shoulder ABduction --    Left Shoulder Internal Rotation --    Left Shoulder External Rotation --             LYMPHEDEMA/ONCOLOGY QUESTIONNAIRE - 02/08/20 0001      Type   Cancer Type Left breast cancer      Surgeries   Lumpectomy Date 04/27/19    Other Surgery Date 05/08/20   excised margins   Number Lymph Nodes Removed 3   all nodes negative     Date Lymphedema/Swelling Started   Date 01/25/20      Treatment   Active Chemotherapy Treatment No    Past Chemotherapy Treatment Yes    Active Radiation Treatment No    Past Radiation Treatment Yes    Current Hormone Treatment No    Past Hormone Therapy No      What other symptoms do you have   Are you Having Heaviness or Tightness Yes    Are you having Pain  Yes    Are you having pitting edema No    Is it  Hard or Difficult finding clothes that fit No    Do you have infections No    Is there Decreased scar mobility Yes      Lymphedema Assessments   Lymphedema Assessments Upper extremities      Right Upper Extremity Lymphedema   15 cm Proximal to Olecranon Process 29 cm    10 cm Proximal to Olecranon Process 27.3 cm    Olecranon Process 24.3 cm    10 cm Proximal to Ulnar Styloid Process 19.7 cm    Just Proximal to Ulnar Styloid Process 15.5 cm    Across Hand at [employer] 19 cm    At [place] of 2nd Digit 6.3 cm      Left Upper Extremity Lymphedema   15 cm Proximal to Olecranon Process 30.2 cm    10 cm Proximal to Olecranon Process 29 cm    Olecranon Process 24 cm    10 cm Proximal to Ulnar Styloid Process 19 cm    Just Proximal to Ulnar Styloid Process 14.6 cm    Across Hand at [employer] 18.6 cm    At [place] of 2nd Digit 6.5 cm                   Outpatient Re[facility] from [facility]/08/[facility]  Lymphedema Life Impact Scale Total Score 5.88 %      Objective measurements completed on examination: See above findings.       [place] Adult PT Treatment/Exercise - 02/08/20 0001      Manual Therapy   Manual Therapy Edema management    Edema Management created foam chip pack for pt to wear in her bra for additional compression, educated pt on where to obtain a commercially available compression bra                  PT Education - 02/08/20 1401    Education Details anatomy and physiology of lymphatic system, need for compression, possible triggers of lymphedema    Person(s) Educated Patient    Methods Explanation    Comprehension Verbalized understanding               PT Long Term Goals - 02/08/20 1355      PT LONG TERM GOAL #1   Title Pt will be independent in self MLD for long term management of lymphedema    Time 6    Period Weeks    Status New    Target Date  03/07/20      PT LONG TERM GOAL #2   Title Pt will receive appropriate compression garments for long term management of lymphedema    Time 6    Period Weeks    Status New    Target Date 03/21/20      PT LONG TERM GOAL #3   Title Pt will report a 50% improvement in swelling and discomfort in left breast to decrease risk of infection    Time 6    Period Weeks    Status New    Target Date 03/21/20                  Plan - 02/08/20 1357    Clinical Impression Statement Pt presents to PT with recent onset of left breast lymphedema. She underwent a left lumpectomy and SNLB on 04/27/19 and then had a re excision on 05/09/19. Pt has completed radiation and chemotherapy. She recently flew to the [place] and then developed lymphedema  shortly after her trip. Pt reports discomfort in left breast and tightness in L axilla with L end range shoulder ROM. Pt would benefit from skilled PT services to decrease left breast lymphedema, decrease left axillary tightness and assist pt with obtaining compression garments. 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    Stability/Clinical Decision Making Stable/Uncomplicated    Clinical Decision Making Low    Rehab Potential Excellent    PT Frequency 2x / week    PT Duration 6 weeks    PT Treatment/Interventions ADLs/Self Care Home Management;Therapeutic exercise;Patient/family education;Manual lymph drainage;Compression bandaging;Taping;Vasopneumatic Device    PT Next Visit Plan begin MLD to L breast, instruct adn give handout eventually, check for signed Rx for bra, sleeve and glove and send pt locally to obtain    PT Home Exercise Plan wear chip pack    Consulted and Agree with Plan of Care Patient           Patient will benefit from skilled therapeutic intervention in order to improve the following deficits and impairments:  Increased edema, Decreased knowledge of precautions, Increased  fascial restricitons, Decreased scar mobility  Visit Diagnosis: Lymphedema, not elsewhere classified - Plan: PT plan of care cert/re-cert  Abnormal posture - Plan: PT plan of care cert/re-cert     Problem List Patient Active Problem List   Diagnosis Date Noted  . Genetic testing 05/12/2019  . Family history of breast cancer   . Family history of pancreatic cancer   . Family history of stomach cancer   . Family history of leukemia   . Malignant neoplasm of upper-inner quadrant of left breast in female, estrogen receptor negative ([place]) 04/18/2019  . Moderate obstructive sleep apnea 03/02/2016  . Drusen 10/14/2015    Allyson Sabal [facility] 02/08/2020, 4:01 PM  [place], [place], [address] Phone: [phone]   Fax:  [phone]  Name: Manie Bealer MRN: [id number] Date of Birth: 01/25/1956  Manus Gunning, PT 02/08/20 4:01 PM

## 2020-02-22 ENCOUNTER — Other Ambulatory Visit: Payer: Self-pay

## 2020-02-22 ENCOUNTER — Ambulatory Visit: Payer: Medicare Other | Admitting: Physical Therapy

## 2020-02-22 ENCOUNTER — Encounter: Payer: Self-pay | Admitting: Physical Therapy

## 2020-02-22 DIAGNOSIS — Z483 Aftercare following surgery for neoplasm: Secondary | ICD-10-CM

## 2020-02-22 DIAGNOSIS — I890 Lymphedema, not elsewhere classified: Secondary | ICD-10-CM | POA: Diagnosis not present

## 2020-02-22 NOTE — Therapy (Signed)
[place], [place], [address] Phone: [phone]   Fax:  [phone]  Physical Therapy Treatment  Patient Details  Name: Laura Fisher MRN: [id number] Date of Birth: October 27, 1955 Referring Provider (PT): Dr. Stark Klein   Encounter Date: 02/22/2020   PT End of Session - 02/22/20 1400    Visit Number 2    Number of Visits 13    Date for PT Re-Evaluation 03/21/20    PT Start Time 1302    PT Stop Time 1400    PT Time Calculation (min) 58 min    Activity Tolerance Patient tolerated treatment well    Behavior During Therapy [facility] for tasks assessed/performed           Past Medical History:  Diagnosis Date   Allergy    seasonal   Anal fissure    Arthritis    right knee   Cancer ([place])    breast left side dx 04/14/19   Colon polyp    Complication of anesthesia    Family history of pseudocholinesterase deficiency   Family history of adverse reaction to anesthesia    pt's mother had pseudocholinesterase deficiency   Family history of breast cancer    Family history of leukemia    Family history of pancreatic cancer    Family history of pseudocholinesterase deficiency    mother   Family history of stomach cancer    Fatty liver    GERD (gastroesophageal reflux disease)    Heart murmur    benign per pt   High blood pressure    History of IBS    Obesity    Osteopenia    PONV (postoperative nausea and vomiting)    "terrible nausea" with a D&C for a miscarriage and after T&A   Pre-diabetes    Sleep apnea    use a oral device    Past Surgical History:  Procedure Laterality Date   BREAST LUMPECTOMY WITH RADIOACTIVE SEED AND SENTINEL LYMPH NODE BIOPSY Left 04/27/2019   Procedure: LEFT BREAST LUMPECTOMY WITH RADIOACTIVE SEED AND SENTINEL LYMPH NODE BIOPSY;  Surgeon: Stark Klein, MD;  Location: [place];  Service: General;  Laterality: Left;   COLONOSCOPY  2015    POLYPECTOMY     PORTACATH PLACEMENT Right 04/27/2019   Procedure: INSERTION PORT-A-CATH WITH ULTRASOUND;  Surgeon: Stark Klein, MD;  Location: [place];  Service: General;  Laterality: Right;   RE-EXCISION OF BREAST LUMPECTOMY Left 05/09/2019   Procedure: RE-EXCISION OF LEFT BREAST LUMPECTOMY;  Surgeon: Stark Klein, MD;  Location: [place];  Service: General;  Laterality: Left;   TONSILECTOMY, ADENOIDECTOMY, BILATERAL MYRINGOTOMY AND TUBES     never had tubes in ears   WISDOM TOOTH EXTRACTION     WRIST SURGERY Bilateral     There were no vitals filed for this visit.   Subjective Assessment - 02/22/20 1306    Subjective I have been wearing the compression bra 24/7.    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 It has a Ki67 of 40%. She had a left lumpectomy and sentinel node biopsy on 04/27/2019 (0/3 nodes positive) and then a re-excision on 05/09/2019.    Patient Stated Goals to get swelling down    Currently in Pain? Yes    Pain Score 2     Pain Location Breast    Pain Orientation Left    Pain Descriptors / Indicators Discomfort;Dull  LYMPHEDEMA/ONCOLOGY QUESTIONNAIRE - 02/22/20 0001      Left Upper Extremity Lymphedema   15 cm Proximal to Olecranon Process 29.8 cm    10 cm Proximal to Olecranon Process 27.5 cm    Olecranon Process 23.4 cm    10 cm Proximal to Ulnar Styloid Process 19 cm    Just Proximal to Ulnar Styloid Process 14.5 cm    Across Hand at [employer] 19 cm    At [place] of 2nd Digit 6 cm                [facility]patient [facility]  Lymphedema Life Impact Scale Total Score 5.88 %            [facility] Adult PT Treatment/Exercise - 02/22/20 0001      Manual Therapy   Manual Therapy Edema management;Manual Lymphatic Drainage (MLD);Myofascial release    Edema Management  assessed pt's compression bra for proper fit- she purchased the prairie bra that is one step down from the hugger- educated pt about [employer] as another option    Myofascial Release along scar tissue to left lumpectomy scar around nipple and to fibrosis superior to nipple    Manual Lymphatic Drainage (MLD) in supine: short neck, 5 diaphragmatic breaths, right axillary nodes and establishment of interaxillary pathway, left inguinal nodes and establishment of interaxillary pathway, left breast moving fluid towards pathways and retracing all steps                       PT Long Term Goals - 02/08/20 1355      PT LONG TERM GOAL #1   Title Pt will be independent in self MLD for long term management of lymphedema    Time 6    Period Weeks    Status New    Target Date 03/07/20      PT LONG TERM GOAL #2   Title Pt will receive appropriate compression garments for long term management of lymphedema    Time 6    Period Weeks    Status New    Target Date 03/21/20      PT LONG TERM GOAL #3   Title Pt will report a 50% improvement in swelling and discomfort in left breast to decrease risk of infection    Time 6    Period Weeks    Status New    Target Date 03/21/20                 Plan - 02/22/20 1401    Clinical Impression Statement Began MLD to L breast today. Assessed pt's compression bra for fit and educated pt about prairie hugger which has greater coverage on the sides. Educated pt wear to place chip pack in bra to help decrease fibrosis. Pt has area of dimpling supieror to scar around left nipple. Performed myofascial release to scar tissue in this area. Dimpling more apparant at end of session. Educated pt to continue with scar massage at home and wearing chip pack. Will continue MLD to left breast to try and decrease dimpled area.    PT Frequency 2x / week    PT Duration 6 weeks    PT Treatment/Interventions ADLs/Self Care Home Management;Therapeutic  exercise;Patient/family education;Manual lymph drainage;Compression bandaging;Taping;Vasopneumatic Device    PT Next Visit Plan continue MLD to L breast, instruct and give handout, check for signed Rx for bra, sleeve and glove and send pt locally to obtain  PT Home Exercise Plan wear chip pack    Consulted and Agree with Plan of Care Patient           Patient will benefit from skilled therapeutic intervention in order to improve the following deficits and impairments:  Increased edema, Decreased knowledge of precautions, Increased fascial restricitons, Decreased scar mobility  Visit Diagnosis: Lymphedema, not elsewhere classified  Aftercare following surgery for neoplasm     Problem List Patient Active Problem List   Diagnosis Date Noted   Genetic testing 05/12/2019   Family history of breast cancer    Family history of pancreatic cancer    Family history of stomach cancer    Family history of leukemia    Malignant neoplasm of upper-inner quadrant of left breast in female, estrogen receptor negative ([place]) 04/18/2019   Moderate obstructive sleep apnea 03/02/2016   Drusen 10/14/2015    Allyson Sabal [facility] 02/22/2020, 2:04 PM  [place], [place], [address] Phone: [phone]   Fax:  [phone]  Name: Laura Fisher MRN: [id number] Date of Birth: [birth year]  Manus Gunning, PT 02/22/20 2:04 PM

## 2020-02-27 ENCOUNTER — Encounter: Payer: Self-pay | Admitting: Physical Therapy

## 2020-02-27 ENCOUNTER — Other Ambulatory Visit: Payer: Self-pay

## 2020-02-27 ENCOUNTER — Ambulatory Visit: Payer: Medicare Other | Attending: General Surgery | Admitting: Physical Therapy

## 2020-02-27 DIAGNOSIS — C50212 Malignant neoplasm of upper-inner quadrant of left female breast: Secondary | ICD-10-CM | POA: Diagnosis present

## 2020-02-27 DIAGNOSIS — Z171 Estrogen receptor negative status [ER-]: Secondary | ICD-10-CM | POA: Insufficient documentation

## 2020-02-27 DIAGNOSIS — Z483 Aftercare following surgery for neoplasm: Secondary | ICD-10-CM | POA: Diagnosis present

## 2020-02-27 DIAGNOSIS — I890 Lymphedema, not elsewhere classified: Secondary | ICD-10-CM | POA: Insufficient documentation

## 2020-02-27 DIAGNOSIS — R293 Abnormal posture: Secondary | ICD-10-CM | POA: Insufficient documentation

## 2020-02-27 NOTE — Patient Instructions (Signed)
Self manual lymph drainage: Perform this sequence once a day.  Only give enough pressure no your skin to make the skin move.  Diaphragmatic - Supine   Inhale through nose making navel move out toward hands. Exhale through puckered lips, hands follow navel in. Repeat _5__ times. Rest _10__ seconds between repeats.   Copyright  VHI. All rights reserved.  Hug yourself.  Do circles at your neck just above your collarbones.  Repeat this 10 times.  Axilla - One at a Time   Using full weight of flat hand and fingers at center of uninvolved armpit, make _10__ in-place circles.   Copyright  VHI. All rights reserved.  LEG: Inguinal Nodes Stimulation   With small finger side of hand against hip crease on involved side, gently perform circles at the crease. Repeat __10_ times.   Copyright  VHI. All rights reserved.  1) Axilla to Inguinal Nodes - Sweep   On involved side, stretch skin _4__ times from armpit along side of trunk to hip crease.  Now gently stretch skin from the involved side to the uninvolved side across the chest at the shoulder line.  Repeat that 4 times.  Draw an imaginary diagonal line from upper outer breast through the nipple area toward lower inner breast.  Direct fluid upward and inward from this line toward the pathway across your upper chest .  Do this in three rows to treat all of the upper inner breast tissue, and do each row 3-4x.      Direct fluid to treat all of lower outer breast tissue downward and outward toward      pathway that is aimed at the left groin.  Finish by doing the pathways as described above going from your involved armpit to the same side groin and going across your upper chest from the involved shoulder to the uninvolved shoulder.  Repeat the steps above where you do circles in your left groin and right armpit. Copyright  VHI. All rights reserved.   

## 2020-02-27 NOTE — Therapy (Signed)
[place], [place], [address] Phone: [phone]   Fax:  [phone]  Physical Therapy Treatment  Patient Details  Name: Laura Fisher MRN: [id number] Date of Birth: 25-Jan-1956 Referring Provider (PT): Dr. Stark Klein   Encounter Date: 02/27/2020   PT End of Session - 02/27/20 1456    Visit Number 3    Number of Visits 13    Date for PT Re-Evaluation 03/21/20    PT Start Time 6861    PT Stop Time 1456    PT Time Calculation (min) 49 min    Activity Tolerance Patient tolerated treatment well    Behavior During Therapy [facility] for tasks assessed/performed           Past Medical History:  Diagnosis Date  . Allergy    seasonal  . Anal fissure   . Arthritis    right knee  . Cancer [facility])    breast left side dx 04/14/19  . Colon polyp   . Complication of anesthesia    Family history of pseudocholinesterase deficiency  . Family history of adverse reaction to anesthesia    pt's mother had pseudocholinesterase deficiency  . Family history of breast cancer   . Family history of leukemia   . Family history of pancreatic cancer   . Family history of pseudocholinesterase deficiency    mother  . Family history of stomach cancer   . Fatty liver   . GERD (gastroesophageal reflux disease)   . Heart murmur    benign per pt  . High blood pressure   . History of IBS   . Obesity   . Osteopenia   . PONV (postoperative nausea and vomiting)    "terrible nausea" with a D&C for a miscarriage and after T&A  . Pre-diabetes   . Sleep apnea    use a oral device    Past Surgical History:  Procedure Laterality Date  . BREAST LUMPECTOMY WITH RADIOACTIVE SEED AND SENTINEL LYMPH NODE BIOPSY Left 04/27/2019   Procedure: LEFT BREAST LUMPECTOMY WITH RADIOACTIVE SEED AND SENTINEL LYMPH NODE BIOPSY;  Surgeon: Stark Klein, MD;  Location: [place];  Service: General;  Laterality: Left;  . COLONOSCOPY  2015  .  POLYPECTOMY    . PORTACATH PLACEMENT Right 04/27/2019   Procedure: INSERTION PORT-A-CATH WITH ULTRASOUND;  Surgeon: Stark Klein, MD;  Location: [place];  Service: General;  Laterality: Right;  . RE-EXCISION OF BREAST LUMPECTOMY Left 05/09/2019   Procedure: RE-EXCISION OF LEFT BREAST LUMPECTOMY;  Surgeon: Stark Klein, MD;  Location: [place];  Service: General;  Laterality: Left;  . TONSILECTOMY, ADENOIDECTOMY, BILATERAL MYRINGOTOMY AND TUBES     never had tubes in ears  . WISDOM TOOTH EXTRACTION    . WRIST SURGERY Bilateral     There were no vitals filed for this visit.   Subjective Assessment - 02/27/20 1408    Subjective I got the Hugger bra and I wore it today so you could see it.    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 It has a Ki67 of 40%. She had a left lumpectomy and sentinel node biopsy on 04/27/2019 (0/3 nodes positive) and then a re-excision on 05/09/2019.    Patient Stated Goals to get swelling down    Currently in Pain? No/denies    Pain Score 0-No pain  Outpatient [facility] from [facility]  Lymphedema Life Impact Scale Total Score 5.88 %            [facility] Adult PT Treatment/Exercise - 02/27/20 0001      Manual Therapy   Edema Management assessed pt's new compression bra for proper fit- she obtained the prairie hugger bra- educated pt she can try sleeping without compression bra to assess for increase in swelling    Myofascial Release along scar tissue to left lumpectomy scar around nipple and to fibrosis superior to nipple    Manual Lymphatic Drainage (MLD) in supine: short neck, 5 diaphragmatic breaths, right axillary nodes and establishment of interaxillary pathway, left inguinal nodes and establishment of interaxillary pathway, left breast moving fluid towards pathways and  retracing all steps- issued handout and had pt return demonstrate correct technique and sequence, pt required mod to max verbal and tactile cues for correct skin stretch, pressure and timing                       PT Long Term Goals - 02/08/20 1355      PT LONG TERM GOAL #1   Title Pt will be independent in self MLD for long term management of lymphedema    Time 6    Period Weeks    Status New    Target Date 03/07/20      PT LONG TERM GOAL #2   Title Pt will receive appropriate compression garments for long term management of lymphedema    Time 6    Period Weeks    Status New    Target Date 03/21/20      PT LONG TERM GOAL #3   Title Pt will report a 50% improvement in swelling and discomfort in left breast to decrease risk of infection    Time 6    Period Weeks    Status New    Target Date 03/21/20                 Plan - 02/27/20 1502    Clinical Impression Statement Instructed pt today in self MLD and issued handout. Provided hand over hand instruction as well as verbal and tactile cues for pt to perform self MLD correctly. Educated pt to try sleeping without her compression bra and assess her breast for increased swelling. If she notices increased swelling she should return to wearing her compression bra at night. Today her swelling appeared very minimal and scar tissue seems to be improving.    PT Frequency 2x / week    PT Duration 6 weeks    PT Treatment/Interventions ADLs/Self Care Home Management;Therapeutic exercise;Patient/family education;Manual lymph drainage;Compression bandaging;Taping;Vasopneumatic Device    PT Next Visit Plan instruct in strength ABC prior to d/c, continue MLD to L breast, assess pt indep with self MLD, sleeve and glove and send pt locally to obtain    PT Home Exercise Plan wear chip pack and compression bra    Consulted and Agree with Plan of Care Patient           Patient will benefit from skilled therapeutic intervention  in order to improve the following deficits and impairments:  Increased edema, Decreased knowledge of precautions, Increased fascial restricitons, Decreased scar mobility  Visit Diagnosis: Lymphedema, not elsewhere classified     Problem List Patient Active Problem List   Diagnosis Date Noted  . Genetic testing 05/12/2019  . Family history of breast cancer   .  Family history of pancreatic cancer   . Family history of stomach cancer   . Family history of leukemia   . Malignant neoplasm of upper-inner quadrant of left breast in female, estrogen receptor negative ([place]) 04/18/2019  . Moderate obstructive sleep apnea 03/02/2016  . Dr[place]en 10/14/2015    Allyson Sabal [facility] 02/27/2020, 3:05 PM  [place], [place], [address] Phone: [phone]   Fax:  [phone]  Name: Laura Fisher MRN: [id number] Date of Birth: 06/21/1956  Manus Gunning, PT 02/27/20 3:05 PM

## 2020-03-01 ENCOUNTER — Encounter: Payer: Self-pay | Admitting: Rehabilitation

## 2020-03-01 ENCOUNTER — Ambulatory Visit: Payer: Medicare Other | Admitting: Rehabilitation

## 2020-03-01 ENCOUNTER — Other Ambulatory Visit: Payer: Self-pay

## 2020-03-01 DIAGNOSIS — I890 Lymphedema, not elsewhere classified: Secondary | ICD-10-CM | POA: Diagnosis not present

## 2020-03-01 DIAGNOSIS — Z483 Aftercare following surgery for neoplasm: Secondary | ICD-10-CM

## 2020-03-01 DIAGNOSIS — Z171 Estrogen receptor negative status [ER-]: Secondary | ICD-10-CM

## 2020-03-01 DIAGNOSIS — R293 Abnormal posture: Secondary | ICD-10-CM

## 2020-03-01 NOTE — Therapy (Signed)
[place], [place], [address] Phone: [phone]   Fax:  [phone]  Physical Therapy Treatment  Patient Details  Name: Laura Fisher MRN: [id number] Date of Birth: [birth year] Referring Provider (PT): Dr. Stark Klein   Encounter Date: 03/01/2020   PT End of Session - 03/01/20 1000    Visit Number 4    Number of Visits 13    Date for PT Re-Evaluation 03/21/20    PT Start Time 0901    PT Stop Time 1000    PT Time Calculation (min) 59 min    Activity Tolerance Patient tolerated treatment well    Behavior During Therapy [facility] for tasks assessed/performed           Past Medical History:  Diagnosis Date  . Allergy    seasonal  . Anal fissure   . Arthritis    right knee  . Cancer [facility])    breast left side dx 04/14/19  . Colon polyp   . Complication of anesthesia    Family history of pseudocholinesterase deficiency  . Family history of adverse reaction to anesthesia    pt's mother had pseudocholinesterase deficiency  . Family history of breast cancer   . Family history of leukemia   . Family history of pancreatic cancer   . Family history of pseudocholinesterase deficiency    mother  . Family history of stomach cancer   . Fatty liver   . GERD (gastroesophageal reflux disease)   . Heart murmur    benign per pt  . High blood pressure   . History of IBS   . Obesity   . Osteopenia   . PONV (postoperative nausea and vomiting)    "terrible nausea" with a D&C for a miscarriage and after T&A  . Pre-diabetes   . Sleep apnea    use a oral device    Past Surgical History:  Procedure Laterality Date  . BREAST LUMPECTOMY WITH RADIOACTIVE SEED AND SENTINEL LYMPH NODE BIOPSY Left 04/27/2019   Procedure: LEFT BREAST LUMPECTOMY WITH RADIOACTIVE SEED AND SENTINEL LYMPH NODE BIOPSY;  Surgeon: Stark Klein, MD;  Location: [place];  Service: General;  Laterality: Left;  . COLONOSCOPY  2015  .  POLYPECTOMY    . PORTACATH PLACEMENT Right 04/27/2019   Procedure: INSERTION PORT-A-CATH WITH ULTRASOUND;  Surgeon: Stark Klein, MD;  Location: [place];  Service: General;  Laterality: Right;  . RE-EXCISION OF BREAST LUMPECTOMY Left 05/09/2019   Procedure: RE-EXCISION OF LEFT BREAST LUMPECTOMY;  Surgeon: Stark Klein, MD;  Location: [place];  Service: General;  Laterality: Left;  . TONSILECTOMY, ADENOIDECTOMY, BILATERAL MYRINGOTOMY AND TUBES     never had tubes in ears  . WISDOM TOOTH EXTRACTION    . WRIST SURGERY Bilateral     There were no vitals filed for this visit.   Subjective Assessment - 03/01/20 0903    Subjective I don't feel like I have a big problem.  I have to confess that I did not do it like I should.    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 It has a Ki67 of 40%. She had a left lumpectomy and sentinel node biopsy on 04/27/2019 (0/3 nodes positive) and then a re-excision on 05/09/2019.    Patient Stated Goals to get swelling down    Currently in Pain? No/denies  [facility]patient [facility]  Lymphedema Life Impact Scale Total Score 5.88 %            [facility] Adult PT Treatment/Exercise - 03/01/20 0001      Manual Therapy   Manual therapy comments applied some Eucerin lotion to the dry radiation dermatitis region and educated pt to stop scrathcing this area    Edema Management focused on seated in front of mirror MLD education with new handout given to add pictures and more cues which pt found beneficial. All steps with moderate vcs/tcs but able to perform well with cueing    Manual Lymphatic Drainage (MLD) in supine: short neck, 5 diaphragmatic breaths, bil axillary nodes and establishment of interaxillary pathway, left inguinal nodes and establishment of interaxillary pathway,  left breast moving fluid towards pathways and retracing all steps                       PT Long Term Goals - 02/08/20 1355      PT LONG TERM GOAL #1   Title Pt will be independent in self MLD for long term management of lymphedema    Time 6    Period Weeks    Status New    Target Date 03/07/20      PT LONG TERM GOAL #2   Title Pt will receive appropriate compression garments for long term management of lymphedema    Time 6    Period Weeks    Status New    Target Date 03/21/20      PT LONG TERM GOAL #3   Title Pt will report a 50% improvement in swelling and discomfort in left breast to decrease risk of infection    Time 6    Period Weeks    Status New    Target Date 03/21/20                 Plan - 03/01/20 1001    Clinical Impression Statement Reviewed self MLD seated and supine with focus on showing pt correct spots to work on the breast.  Hand over hand and moderate cueing needed for completion.  Mild general edema laterally and mild scar tissue vs fibrosis medial/inferior breast towards nipple.    PT Frequency 2x / week    PT Duration 6 weeks    PT Treatment/Interventions ADLs/Self Care Home Management;Therapeutic exercise;Patient/family education;Manual lymph drainage;Compression bandaging;Taping;Vasopneumatic Device    PT Next Visit Plan start strength ABC, continue MLD to L breast, assess pt indep with self MLD, sleeve and glove and send pt locally to obtain    PT Home Exercise Plan wear chip pack and compression bra    Consulted and Agree with Plan of Care Patient           Patient will benefit from skilled therapeutic intervention in order to improve the following deficits and impairments:     Visit Diagnosis: Lymphedema, not elsewhere classified  Aftercare following surgery for neoplasm  Abnormal posture  Malignant neoplasm of upper-inner quadrant of left breast in female, estrogen receptor negative ([place])     Problem List Patient  Active Problem List   Diagnosis Date Noted  . Genetic testing 05/12/2019  . Family history of breast cancer   . Family history of pancreatic cancer   . Family history of stomach cancer   . Family history of leukemia   . Malignant neoplasm of upper-inner quadrant of left breast in female, estrogen receptor  negative ([place]) 04/18/2019  . Moderate obstructive sleep apnea 03/02/2016  . Dr[place]en 10/14/2015    Shan Levans, PT 03/01/2020, 10:03 AM  [place], [place], [address] Phone: [phone]   Fax:  [phone]  Name: Laura Fisher MRN: [id number] Date of Birth: Dec 17, 1955

## 2020-03-01 NOTE — Patient Instructions (Signed)

## 2020-03-04 ENCOUNTER — Other Ambulatory Visit: Payer: Self-pay

## 2020-03-04 ENCOUNTER — Encounter: Payer: Self-pay | Admitting: Physical Therapy

## 2020-03-04 ENCOUNTER — Ambulatory Visit: Payer: Medicare Other | Admitting: Physical Therapy

## 2020-03-04 DIAGNOSIS — I890 Lymphedema, not elsewhere classified: Secondary | ICD-10-CM

## 2020-03-04 DIAGNOSIS — Z483 Aftercare following surgery for neoplasm: Secondary | ICD-10-CM

## 2020-03-04 NOTE — Therapy (Signed)
[place], [place], [address] Phone: [phone]   Fax:  [phone]  Physical Therapy Treatment  Patient Details  Name: Laura Fisher MRN: [id number] Date of Birth: Feb 21, 1956 Referring Provider (PT): Dr. Stark Klein   Encounter Date: 03/04/2020   PT End of Session - 03/04/20 1214    Visit Number 5    Number of Visits 13    Date for PT Re-Evaluation 03/21/20    PT Start Time 1109   pt arrived late   PT Stop Time 1200    PT Time Calculation (min) 51 min    Activity Tolerance Patient tolerated treatment well    Behavior During Therapy [facility] for tasks assessed/performed           Past Medical History:  Diagnosis Date  . Allergy    seasonal  . Anal fissure   . Arthritis    right knee  . Cancer [facility])    breast left side dx 04/14/19  . Colon polyp   . Complication of anesthesia    Family history of pseudocholinesterase deficiency  . Family history of adverse reaction to anesthesia    pt's mother had pseudocholinesterase deficiency  . Family history of breast cancer   . Family history of leukemia   . Family history of pancreatic cancer   . Family history of pseudocholinesterase deficiency    mother  . Family history of stomach cancer   . Fatty liver   . GERD (gastroesophageal reflux disease)   . Heart murmur    benign per pt  . High blood pressure   . History of IBS   . Obesity   . Osteopenia   . PONV (postoperative nausea and vomiting)    "terrible nausea" with a D&C for a miscarriage and after T&A  . Pre-diabetes   . Sleep apnea    use a oral device    Past Surgical History:  Procedure Laterality Date  . BREAST LUMPECTOMY WITH RADIOACTIVE SEED AND SENTINEL LYMPH NODE BIOPSY Left 04/27/2019   Procedure: LEFT BREAST LUMPECTOMY WITH RADIOACTIVE SEED AND SENTINEL LYMPH NODE BIOPSY;  Surgeon: Stark Klein, MD;  Location: [place];  Service: General;  Laterality: Left;  .  COLONOSCOPY  2015  . POLYPECTOMY    . PORTACATH PLACEMENT Right 04/27/2019   Procedure: INSERTION PORT-A-CATH WITH ULTRASOUND;  Surgeon: Stark Klein, MD;  Location: [place];  Service: General;  Laterality: Right;  . RE-EXCISION OF BREAST LUMPECTOMY Left 05/09/2019   Procedure: RE-EXCISION OF LEFT BREAST LUMPECTOMY;  Surgeon: Stark Klein, MD;  Location: [place];  Service: General;  Laterality: Left;  . TONSILECTOMY, ADENOIDECTOMY, BILATERAL MYRINGOTOMY AND TUBES     never had tubes in ears  . WISDOM TOOTH EXTRACTION    . WRIST SURGERY Bilateral     There were no vitals filed for this visit.   Subjective Assessment - 03/04/20 1111    Subjective I tried to not sleep with the bra last night and when I woke up it did feel more full.    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 It has a Ki67 of 40%. She had a left lumpectomy and sentinel node biopsy on 04/27/2019 (0/3 nodes positive) and then a re-excision on 05/09/2019.    Patient Stated Goals to get swelling down    Currently in Pain? No/denies    Pain Score 0-No pain  [facility]patient [facility]  Lymphedema Life Impact Scale Total Score 5.88 %            [facility] Adult PT Treatment/Exercise - 03/04/20 0001      Manual Therapy   Manual Therapy Edema management;Manual Lymphatic Drainage (MLD);Soft tissue mobilization;Passive ROM    Soft tissue mobilization using lotion to left pec in area of tightness and tenderness    Manual Lymphatic Drainage (MLD) pt demonstrated self MLD today as follows in supine: short neck, 5 diaphragmatic breaths, right axillary nodes, left inguinal nodes, establishment of interaxillay and axillo inguinal pathway, left breast moving fluid towards pathways then retracing all steps. Pt required v/c for correct sequence as  well as hand technique. Educated pt to also focus on area of swelling in left axilla.     Passive ROM to left shoulder in to flexion while performing STM to pec muscle                       PT Long Term Goals - 02/08/20 1355      PT LONG TERM GOAL #1   Title Pt will be independent in self MLD for long term management of lymphedema    Time 6    Period Weeks    Status New    Target Date 03/07/20      PT LONG TERM GOAL #2   Title Pt will receive appropriate compression garments for long term management of lymphedema    Time 6    Period Weeks    Status New    Target Date 03/21/20      PT LONG TERM GOAL #3   Title Pt will report a 50% improvement in swelling and discomfort in left breast to decrease risk of infection    Time 6    Period Weeks    Status New    Target Date 03/21/20                 Plan - 03/04/20 1215    Clinical Impression Statement Continued to educate pt on proper self MLD technique. Pt went without her compression bra and woke up feeling heaviness in her left lateral trunk and axilla. Educated pt to spend time working on area of swelling in left axilla and lateral trunk when doing self MLD. Also began STM to left pec muscle which is very tight and tender.    PT Frequency 2x / week    PT Duration 6 weeks    PT Treatment/Interventions ADLs/Self Care Home Management;Therapeutic exercise;Patient/family education;Manual lymph drainage;Compression bandaging;Taping;Vasopneumatic Device    PT Next Visit Plan start strength ABC, continue MLD to L breast, assess pt indep with self MLD, sleeve and glove and send pt locally to obtain    PT Home Exercise Plan wear chip pack and compression bra    Consulted and Agree with Plan of Care Patient           Patient will benefit from skilled therapeutic intervention in order to improve the following deficits and impairments:  Increased edema, Decreased knowledge of precautions, Increased fascial restricitons,  Decreased scar mobility  Visit Diagnosis: Lymphedema, not elsewhere classified  Aftercare following surgery for neoplasm     Problem List Patient Active Problem List   Diagnosis Date Noted  . Genetic testing 05/12/2019  . Family history of breast cancer   . Family history of pancreatic cancer   . Family history of stomach cancer   .  Family history of leukemia   . Malignant neoplasm of upper-inner quadrant of left breast in female, estrogen receptor negative ([place]) 04/18/2019  . Moderate obstructive sleep apnea 03/02/2016  . Drusen 10/14/2015    Allyson Sabal [facility] 03/04/2020, 12:17 PM  [place], [place], [address] Phone: [phone]   Fax:  [phone]  Name: Laura Fisher MRN: [id number] Date of Birth: Aug 15, 1955  Manus Gunning, PT 03/04/20 12:17 PM

## 2020-03-07 ENCOUNTER — Encounter: Payer: Medicare Other | Admitting: Physical Therapy

## 2020-03-11 ENCOUNTER — Ambulatory Visit: Payer: Medicare Other | Admitting: Physical Therapy

## 2020-03-14 ENCOUNTER — Ambulatory Visit: Payer: Medicare Other

## 2020-03-14 ENCOUNTER — Other Ambulatory Visit: Payer: Self-pay

## 2020-03-14 DIAGNOSIS — I890 Lymphedema, not elsewhere classified: Secondary | ICD-10-CM | POA: Diagnosis not present

## 2020-03-14 DIAGNOSIS — C50212 Malignant neoplasm of upper-inner quadrant of left female breast: Secondary | ICD-10-CM

## 2020-03-14 DIAGNOSIS — Z171 Estrogen receptor negative status [ER-]: Secondary | ICD-10-CM

## 2020-03-14 DIAGNOSIS — R293 Abnormal posture: Secondary | ICD-10-CM

## 2020-03-14 DIAGNOSIS — Z483 Aftercare following surgery for neoplasm: Secondary | ICD-10-CM

## 2020-03-14 NOTE — Therapy (Signed)
[place], [place], [address] Phone: [phone]   Fax:  [phone]  Physical Therapy Treatment  Patient Details  Name: Laura Fisher MRN: [id number] Date of Birth: 12-21-1955 Referring Provider (PT): Dr. Stark Klein   Encounter Date: 03/14/2020   PT End of Session - 03/14/20 1108    Visit Number 6    Number of Visits 13    Date for PT Re-Evaluation 03/21/20    PT Start Time 1006    PT Stop Time 1108    PT Time Calculation (min) 62 min    Activity Tolerance Patient tolerated treatment well    Behavior During Therapy [facility] for tasks assessed/performed           Past Medical History:  Diagnosis Date  . Allergy    seasonal  . Anal fissure   . Arthritis    right knee  . Cancer [facility])    breast left side dx 04/14/19  . Colon polyp   . Complication of anesthesia    Family history of pseudocholinesterase deficiency  . Family history of adverse reaction to anesthesia    pt's mother had pseudocholinesterase deficiency  . Family history of breast cancer   . Family history of leukemia   . Family history of pancreatic cancer   . Family history of pseudocholinesterase deficiency    mother  . Family history of stomach cancer   . Fatty liver   . GERD (gastroesophageal reflux disease)   . Heart murmur    benign per pt  . High blood pressure   . History of IBS   . Obesity   . Osteopenia   . PONV (postoperative nausea and vomiting)    "terrible nausea" with a D&C for a miscarriage and after T&A  . Pre-diabetes   . Sleep apnea    use a oral device    Past Surgical History:  Procedure Laterality Date  . BREAST LUMPECTOMY WITH RADIOACTIVE SEED AND SENTINEL LYMPH NODE BIOPSY Left 04/27/2019   Procedure: LEFT BREAST LUMPECTOMY WITH RADIOACTIVE SEED AND SENTINEL LYMPH NODE BIOPSY;  Surgeon: Stark Klein, MD;  Location: [place];  Service: General;  Laterality: Left;  . COLONOSCOPY  2015  .  POLYPECTOMY    . PORTACATH PLACEMENT Right 04/27/2019   Procedure: INSERTION PORT-A-CATH WITH ULTRASOUND;  Surgeon: Stark Klein, MD;  Location: [place];  Service: General;  Laterality: Right;  . RE-EXCISION OF BREAST LUMPECTOMY Left 05/09/2019   Procedure: RE-EXCISION OF LEFT BREAST LUMPECTOMY;  Surgeon: Stark Klein, MD;  Location: [place];  Service: General;  Laterality: Left;  . TONSILECTOMY, ADENOIDECTOMY, BILATERAL MYRINGOTOMY AND TUBES     never had tubes in ears  . WISDOM TOOTH EXTRACTION    . WRIST SURGERY Bilateral     There were no vitals filed for this visit.   Subjective Assessment - 03/14/20 1017    Subjective I've started reusing a nose cream I had that my doctor is going to prescribe me more of. I also started using Mometasone that the radiation doctor at [facility] gave me for the itching at my breast. I'm doing the self MLD daily but it doesn't feel as good when I'm done as when you guys do it.    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 It has a Ki67 of 40%. She had a left lumpectomy and sentinel node biopsy on 04/27/2019 (0/3 nodes positive)  and then a re-excision on 05/09/2019.    Patient Stated Goals to get swelling down    Currently in Pain? No/denies                       Outpatient Re[facility] from [facility]/08/2020 [facility]  Lymphedema Life Impact Scale Total Score 5.88 %            [facility] Adult PT Treatment/Exercise - 03/14/20 0001      Exercises   Exercises Other Exercises    Other Exercises  Began instruction of Strength ABC Packet completing all of stretches with pt returning therapist demo of each      Manual Therapy   Manual Lymphatic Drainage (MLD) In supine: short neck, 5 diaphragmatic breaths, right axillary nodes, left inguinal nodes, establishment of anterior inter-axillay and Lt axillo-inguinal anastomosis,  left breast moving fluid towards pathways then retracing all steps, also instructed her briefly in Rt S/L for better access to lateral breast and Lt axillo-inguinal anastomosis, then finished retracing all steps in supine reviewing with pt and answering her questions throughout.                       PT Long Term Goals - 02/08/20 1355      PT LONG TERM GOAL #1   Title Pt will be independent in self MLD for long term management of lymphedema    Time 6    Period Weeks    Status New    Target Date 03/07/20      PT LONG TERM GOAL #2   Title Pt will receive appropriate compression garments for long term management of lymphedema    Time 6    Period Weeks    Status New    Target Date 03/21/20      PT LONG TERM GOAL #3   Title Pt will report a 50% improvement in swelling and discomfort in left breast to decrease risk of infection    Time 6    Period Weeks    Status New    Target Date 03/21/20                 Plan - 03/14/20 1420    Clinical Impression Statement Began instruction of Strength ABC program today and educating pt in importance of including LE stretches before and/or after the 3-4 miles she has been walking. She verbalized understanding and was able to return good demo. Then continued with manual lymph drainage to Lt breast and reviewing with pt throughout.    Stability/Clinical Decision Making Stable/Uncomplicated    Rehab Potential Excellent    PT Frequency 2x / week    PT Duration 6 weeks    PT Treatment/Interventions ADLs/Self Care Home Management;Therapeutic exercise;Patient/family education;Manual lymph drainage;Compression bandaging;Taping;Vasopneumatic Device    PT Next Visit Plan Complete instruction of strength ABC, continue MLD to L breast, assess pt indep with self MLD, sleeve and glove and send pt locally to obtain    PT Home Exercise Plan wear chip pack and compression bra; self MLD    Consulted and Agree with Plan of Care Patient            Patient will benefit from skilled therapeutic intervention in order to improve the following deficits and impairments:  Increased edema, Decreased knowledge of precautions, Increased fascial restricitons, Decreased scar mobility  Visit Diagnosis: Lymphedema, not elsewhere classified  Aftercare following surgery for neoplasm  Abnormal posture  Malignant neoplasm of upper-inner quadrant of left breast in female, estrogen receptor negative [facility])     Problem List Patient Active Problem List   Diagnosis Date Noted  . Genetic testing 05/12/2019  . Family history of breast cancer   . Family history of pancreatic cancer   . Family history of stomach cancer   . Family history of leukemia   . Malignant neoplasm of upper-inner quadrant of left breast in female, estrogen receptor negative ([place]) 04/18/2019  . Moderate obstructive sleep apnea 03/02/2016  . Drusen 10/14/2015    Otelia Limes, PTA 03/14/2020, 2:44 PM  [place], [place], [address] Phone: [phone]   Fax:  [phone]  Name: Laura Fisher MRN: [id number] Date of Birth: 1956/01/14

## 2020-03-18 ENCOUNTER — Encounter: Payer: Self-pay | Admitting: Rehabilitation

## 2020-03-18 ENCOUNTER — Ambulatory Visit: Payer: Medicare Other | Admitting: Physical Therapy

## 2020-03-20 ENCOUNTER — Other Ambulatory Visit: Payer: Self-pay

## 2020-03-20 ENCOUNTER — Encounter: Payer: Self-pay | Admitting: Physical Therapy

## 2020-03-20 ENCOUNTER — Ambulatory Visit: Payer: Medicare Other | Admitting: Physical Therapy

## 2020-03-20 DIAGNOSIS — I890 Lymphedema, not elsewhere classified: Secondary | ICD-10-CM

## 2020-03-20 DIAGNOSIS — R293 Abnormal posture: Secondary | ICD-10-CM

## 2020-03-20 DIAGNOSIS — Z483 Aftercare following surgery for neoplasm: Secondary | ICD-10-CM

## 2020-03-20 NOTE — Therapy (Signed)
[place], [place], [address] Phone: [phone]   Fax:  [phone]  Physical Therapy Treatment  Patient Details  Name: Laura Fisher MRN: [id number] Date of Birth: [birth year] Referring Provider (PT): Dr. Stark Klein   Encounter Date: 03/20/2020   PT End of Session - 03/20/20 1707    Visit Number 7    Number of Visits 13    Date for PT Re-Evaluation 03/21/20    PT Start Time 1605    PT Stop Time 1653    PT Time Calculation (min) 48 min    Activity Tolerance Patient tolerated treatment well    Behavior During Therapy [facility] for tasks assessed/performed           Past Medical History:  Diagnosis Date  . Allergy    seasonal  . Anal fissure   . Arthritis    right knee  . Cancer [facility])    breast left side dx 04/14/19  . Colon polyp   . Complication of anesthesia    Family history of pseudocholinesterase deficiency  . Family history of adverse reaction to anesthesia    pt's mother had pseudocholinesterase deficiency  . Family history of breast cancer   . Family history of leukemia   . Family history of pancreatic cancer   . Family history of pseudocholinesterase deficiency    mother  . Family history of stomach cancer   . Fatty liver   . GERD (gastroesophageal reflux disease)   . Heart murmur    benign per pt  . High blood pressure   . History of IBS   . Obesity   . Osteopenia   . PONV (postoperative nausea and vomiting)    "terrible nausea" with a D&C for a miscarriage and after T&A  . Pre-diabetes   . Sleep apnea    use a oral device    Past Surgical History:  Procedure Laterality Date  . BREAST LUMPECTOMY WITH RADIOACTIVE SEED AND SENTINEL LYMPH NODE BIOPSY Left 04/27/2019   Procedure: LEFT BREAST LUMPECTOMY WITH RADIOACTIVE SEED AND SENTINEL LYMPH NODE BIOPSY;  Surgeon: Stark Klein, MD;  Location: [place];  Service: General;  Laterality: Left;  . COLONOSCOPY  2015  .  POLYPECTOMY    . PORTACATH PLACEMENT Right 04/27/2019   Procedure: INSERTION PORT-A-CATH WITH ULTRASOUND;  Surgeon: Stark Klein, MD;  Location: [place];  Service: General;  Laterality: Right;  . RE-EXCISION OF BREAST LUMPECTOMY Left 05/09/2019   Procedure: RE-EXCISION OF LEFT BREAST LUMPECTOMY;  Surgeon: Stark Klein, MD;  Location: [place];  Service: General;  Laterality: Left;  . TONSILECTOMY, ADENOIDECTOMY, BILATERAL MYRINGOTOMY AND TUBES     never had tubes in ears  . WISDOM TOOTH EXTRACTION    . WRIST SURGERY Bilateral     There were no vitals filed for this visit.   Subjective Assessment - 03/20/20 1607    Subjective I think my swelling is a little better.    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 It has a Ki67 of 40%. She had a left lumpectomy and sentinel node biopsy on 04/27/2019 (0/3 nodes positive) and then a re-excision on 05/09/2019.    Patient Stated Goals to get swelling down    Currently in Pain? No/denies    Pain Score 0-No pain  [facility]patient [facility]08/[facility]  Lymphedema Life Impact Scale Total Score 5.88 %            [facility] Adult PT Treatment/Exercise - 03/20/20 0001      Exercises   Other Exercises  Instructed pt in the rest of the Strength ABC program and had her return demonstrate all stretches with 20 sec holds and all exercises x 10 reps with 1 lb weight- substituted pelvic tilts for ab curls, did not have pt do deadlift due to osteopenia- provided tactile and verbal cues throughout for pt to perform exercises correctly                       PT Long Term Goals - 02/08/20 1355      PT LONG TERM GOAL #1   Title Pt will be independent in self MLD for long term management of lymphedema    Time 6    Period Weeks    Status New    Target Date 03/07/20       PT LONG TERM GOAL #2   Title Pt will receive appropriate compression garments for long term management of lymphedema    Time 6    Period Weeks    Status New    Target Date 03/21/20      PT LONG TERM GOAL #3   Title Pt will report a 50% improvement in swelling and discomfort in left breast to decrease risk of infection    Time 6    Period Weeks    Status New    Target Date 03/21/20                 Plan - 03/20/20 1708    Clinical Impression Statement Continued instructing pt in Strength ABC program. Was able to complete the program today. Educated pt on proper way to progress exercises at home without increasing risk of lymphedema. Answered pt's questions about self MLD and pt demonstrated sequence and techique to therapist and therapist provided v/c for correct skin stretch. Pt had questions on length of time to perform self MLD as well as best time of day. Answered pt's questions. Will plan to d/c at next visit after pt has a chance to do Strenth ABC program at home and ask any questions she has at next session.    PT Frequency 2x / week    PT Duration 6 weeks    PT Treatment/Interventions ADLs/Self Care Home Management;Therapeutic exercise;Patient/family education;Manual lymph drainage;Compression bandaging;Taping;Vasopneumatic Device    PT Next Visit Plan see if pt has any questions about Strength ABC program and d/c this visit    PT Home Exercise Plan wear chip pack and compression bra; self MLD    Consulted and Agree with Plan of Care Patient           Patient will benefit from skilled therapeutic intervention in order to improve the following deficits and impairments:  Increased edema, Decreased knowledge of precautions, Increased fascial restricitons, Decreased scar mobility  Visit Diagnosis: Abnormal posture  Aftercare following surgery for neoplasm  Lymphedema, not elsewhere classified     Problem List Patient Active Problem List   Diagnosis Date Noted   . Genetic testing 05/12/2019  . Family history of breast cancer   . Family history of pancreatic cancer   . Family history of stomach cancer   . Family history of leukemia   . Malignant neoplasm of upper-inner quadrant of left breast  in female, estrogen receptor negative ([place]) 04/18/2019  . Moderate obstructive sleep apnea 03/02/2016  . Drusen 10/14/2015    Allyson Sabal [facility] 03/20/2020, 5:13 PM  [place], [place], [address] Phone: [phone]   Fax:  [phone]  Name: Laura Fisher MRN: [id number] Date of Birth: 31-May-1956  Manus Gunning, PT 03/20/20 5:13 PM

## 2020-03-21 ENCOUNTER — Encounter: Payer: Medicare Other | Admitting: Physical Therapy

## 2020-03-25 ENCOUNTER — Ambulatory Visit: Payer: Medicare Other | Admitting: Physical Therapy

## 2020-03-25 ENCOUNTER — Other Ambulatory Visit: Payer: Self-pay

## 2020-03-25 ENCOUNTER — Encounter: Payer: Self-pay | Admitting: Physical Therapy

## 2020-03-25 DIAGNOSIS — Z483 Aftercare following surgery for neoplasm: Secondary | ICD-10-CM

## 2020-03-25 DIAGNOSIS — I890 Lymphedema, not elsewhere classified: Secondary | ICD-10-CM | POA: Diagnosis not present

## 2020-03-25 NOTE — Therapy (Signed)
[place], [place], [address] Phone: [phone]   Fax:  [phone]  Physical Therapy Treatment  Patient Details  Name: Laura Fisher MRN: [id number] Date of Birth: 03-May-1956 Referring Provider (PT): Dr. Stark Klein   Encounter Date: 03/25/2020   PT End of Session - 03/25/20 1658    Visit Number 8    Number of Visits 13    Date for PT Re-Evaluation 03/21/20    PT Start Time 1604    PT Stop Time 1658    PT Time Calculation (min) 54 min    Activity Tolerance Patient tolerated treatment well    Behavior During Therapy [facility] for tasks assessed/performed           Past Medical History:  Diagnosis Date  . Allergy    seasonal  . Anal fissure   . Arthritis    right knee  . Cancer [facility])    breast left side dx 04/14/19  . Colon polyp   . Complication of anesthesia    Family history of pseudocholinesterase deficiency  . Family history of adverse reaction to anesthesia    pt's mother had pseudocholinesterase deficiency  . Family history of breast cancer   . Family history of leukemia   . Family history of pancreatic cancer   . Family history of pseudocholinesterase deficiency    mother  . Family history of stomach cancer   . Fatty liver   . GERD (gastroesophageal reflux disease)   . Heart murmur    benign per pt  . High blood pressure   . History of IBS   . Obesity   . Osteopenia   . PONV (postoperative nausea and vomiting)    "terrible nausea" with a D&C for a miscarriage and after T&A  . Pre-diabetes   . Sleep apnea    use a oral device    Past Surgical History:  Procedure Laterality Date  . BREAST LUMPECTOMY WITH RADIOACTIVE SEED AND SENTINEL LYMPH NODE BIOPSY Left 04/27/2019   Procedure: LEFT BREAST LUMPECTOMY WITH RADIOACTIVE SEED AND SENTINEL LYMPH NODE BIOPSY;  Surgeon: Stark Klein, MD;  Location: [place];  Service: General;  Laterality: Left;  . COLONOSCOPY  2015  .  POLYPECTOMY    . PORTACATH PLACEMENT Right 04/27/2019   Procedure: INSERTION PORT-A-CATH WITH ULTRASOUND;  Surgeon: Stark Klein, MD;  Location: [place];  Service: General;  Laterality: Right;  . RE-EXCISION OF BREAST LUMPECTOMY Left 05/09/2019   Procedure: RE-EXCISION OF LEFT BREAST LUMPECTOMY;  Surgeon: Stark Klein, MD;  Location: [place];  Service: General;  Laterality: Left;  . TONSILECTOMY, ADENOIDECTOMY, BILATERAL MYRINGOTOMY AND TUBES     never had tubes in ears  . WISDOM TOOTH EXTRACTION    . WRIST SURGERY Bilateral     There were no vitals filed for this visit.   Subjective Assessment - 03/25/20 1607    Subjective I have not been able to get my pulse up high enough when I exercise. I walked 3.25 miles this morning.    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 It has a Ki67 of 40%. She had a left lumpectomy and sentinel node biopsy on 04/27/2019 (0/3 nodes positive) and then a re-excision on 05/09/2019.    Patient Stated Goals to get swelling down    Currently in Pain? No/denies    Pain Score 0-No pain  [facility]patient [facility]  Lymphedema Life Impact Scale Total Score 5.88 %            [facility] Adult PT Treatment/Exercise - 03/25/20 0001      Manual Therapy   Edema Management Spent session answering pt's questions about exercise to decrease recurrence, intermittent fasting, lymphedema risk reduction practices and need for a sleeve and gauntlet when flying, issued script and info on how to obtain, questions about pulse rate when exercising and  how to increase pulse (educated pt that if her resting pulse is naturally low this may be normal for her), answered questions about when and how to wear swell spot in bra and if swell spot was appropriate size, when to wear compression bra and  what kind of bra to exercise in, discussed pt's wrist pain and educated pt to follow up with her doctor regarding this                       PT Long Term Goals - 03/25/20 1617      PT LONG TERM GOAL #1   Title Pt will be independent in self MLD for long term management of lymphedema    Baseline 03/25/20- independent with self MLD    Time 6    Period Weeks    Status Achieved      PT LONG TERM GOAL #2   Title Pt will receive appropriate compression garments for long term management of lymphedema    Baseline 03/25/20- pt received a compression bra    Time 6    Period Weeks    Status Achieved      PT LONG TERM GOAL #3   Title Pt will report a 50% improvement in swelling and discomfort in left breast to decrease risk of infection    Baseline 03/25/20- pt reports at least 50% improvement    Time 6    Period Weeks    Status Achieved                 Plan - 03/25/20 1702    Clinical Impression Statement Pt has now met all goals for therapy and is ready for discharge from skilled PT services. She does have any questions about Strength ABC program at this time. Spent session answering pt's questions about long term management of lymphedema, exercise to reduce recurrence  etc ( see treatment session for more details. Pt will be discharged at this time.    PT Frequency 2x / week    PT Duration 6 weeks    PT Treatment/Interventions ADLs/Self Care Home Management;Therapeutic exercise;Patient/family education;Manual lymph drainage;Compression bandaging;Taping;Vasopneumatic Device    PT Next Visit Plan d/c this visit    PT Home Exercise Plan wear chip pack and compression bra; self MLD    Recommended Other Services educated pt how to obtain compression sleeve and gauntlet for flying    Consulted and Agree with Plan of Care Patient           Patient will benefit from skilled therapeutic intervention in order to improve the following deficits and impairments:  Increased  edema, Decreased knowledge of precautions, Increased fascial restricitons, Decreased scar mobility  Visit Diagnosis: Aftercare following surgery for neoplasm  Lymphedema, not elsewhere classified     Problem List Patient Active Problem List   Diagnosis Date Noted  . Genetic testing 05/12/2019  . Family history of breast cancer   . Family history of pancreatic cancer   .  Family history of stomach cancer   . Family history of leukemia   . Malignant neoplasm of upper-inner quadrant of left breast in female, estrogen receptor negative ([place]) 04/18/2019  . Moderate obstructive sleep apnea 03/02/2016  . Drusen 10/14/2015    Allyson Sabal [facility] 03/25/2020, 5:04 PM  [place], [place], [address] Phone: [phone]   Fax:  [phone]  Name: Ladana Chavero MRN: [id number] Date of Birth: [birth year]  PHYSICAL THERAPY DISCHARGE SUMMARY  Visits from Start of Care: 8  Current functional level related to goals / functional outcomes: All goals met   Remaining deficits: None   Education / Equipment: HEP, compression garments, self MLD  Plan: Patient agrees to discharge.  Patient goals were met. Patient is being discharged due to meeting the stated rehab goals.  ?????     Allyson Sabal [place], [place] 03/25/20 5:05 PM

## 2020-03-27 ENCOUNTER — Encounter: Payer: Medicare Other | Admitting: Physical Therapy

## 2020-04-26 ENCOUNTER — Telehealth: Payer: Self-pay | Admitting: Internal Medicine

## 2020-04-26 MED ORDER — PANTOPRAZOLE SODIUM 40 MG PO TBEC: 40.0000 mg | DELAYED_RELEASE_TABLET | Freq: Every day | ORAL | 2 refills | Status: AC

## 2020-04-26 NOTE — Telephone Encounter (Signed)
Protonix refilled

## 2020-04-26 NOTE — Telephone Encounter (Signed)
Pt is requesting a refill on her Protonix.  [facility]

## 2020-06-06 IMAGING — MG MM BREAST LOCALIZATION CLIP
4 series · 4 of 12 positions shown · non-contrast
Comparison: Previous exam(s).

CLINICAL DATA: Here for ultrasound-guided biopsy of an irregular
mass in the left breast.

EXAM:
DIAGNOSTIC LEFT MAMMOGRAM POST ULTRASOUND BIOPSY

[L CC synth-2D]
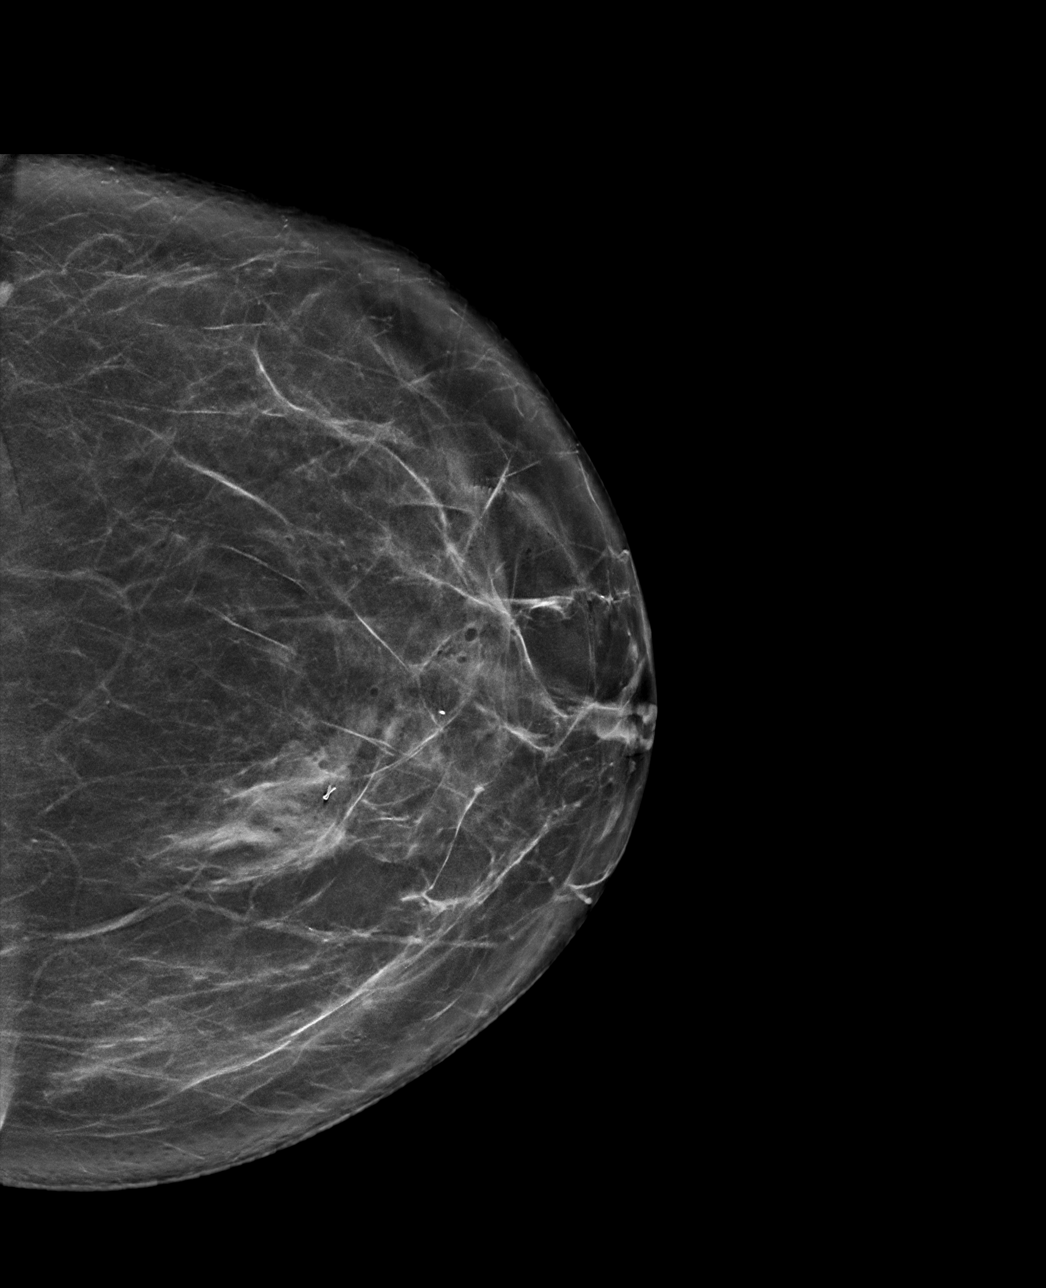

[L ML synth-2D]
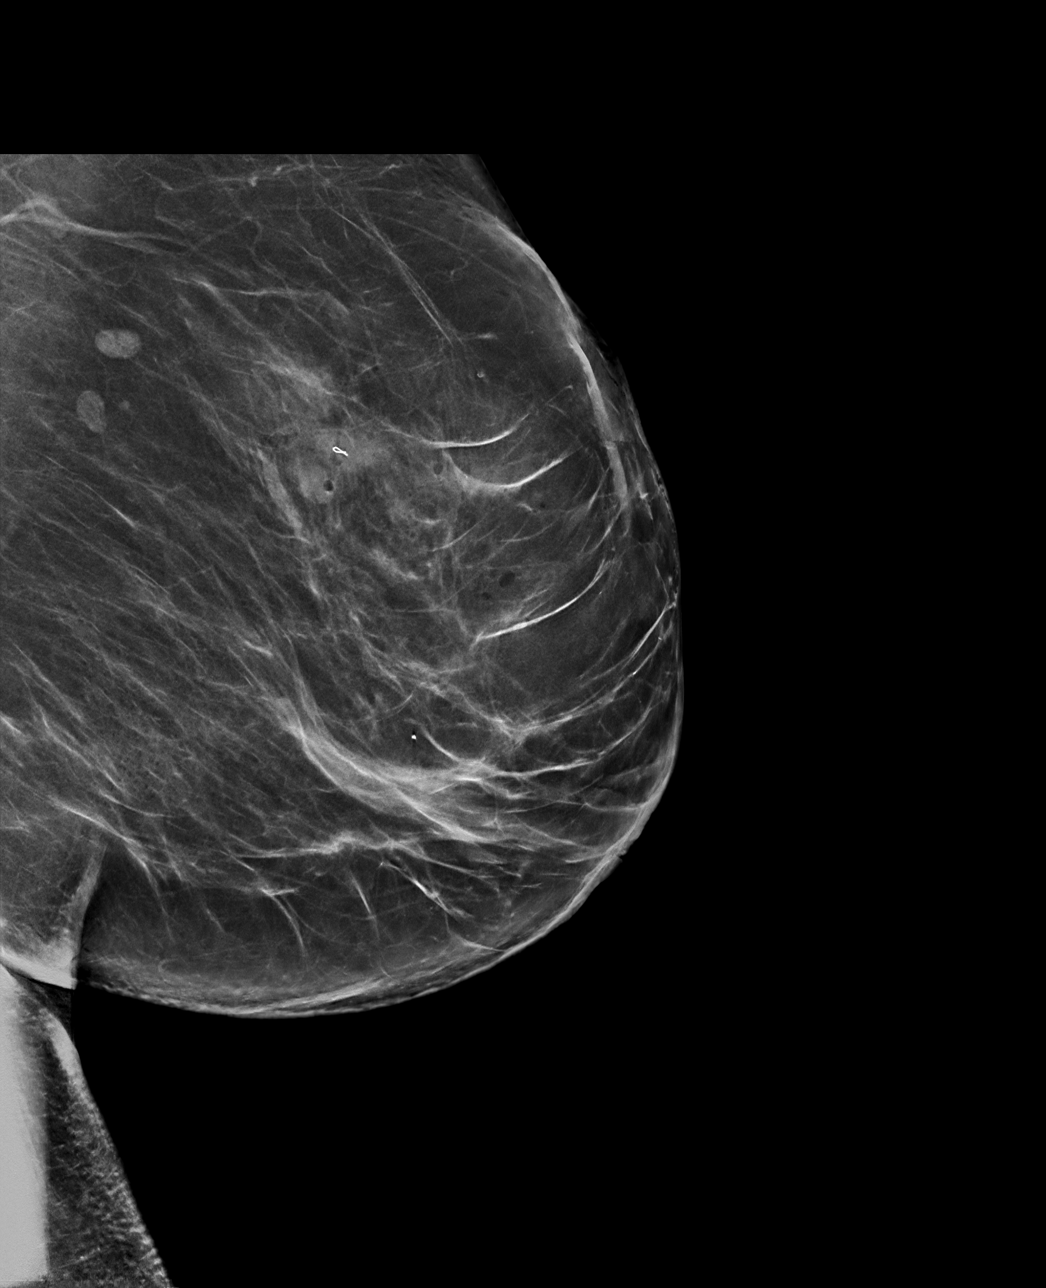

[L ML tomo · tomo slice 41/80.0]
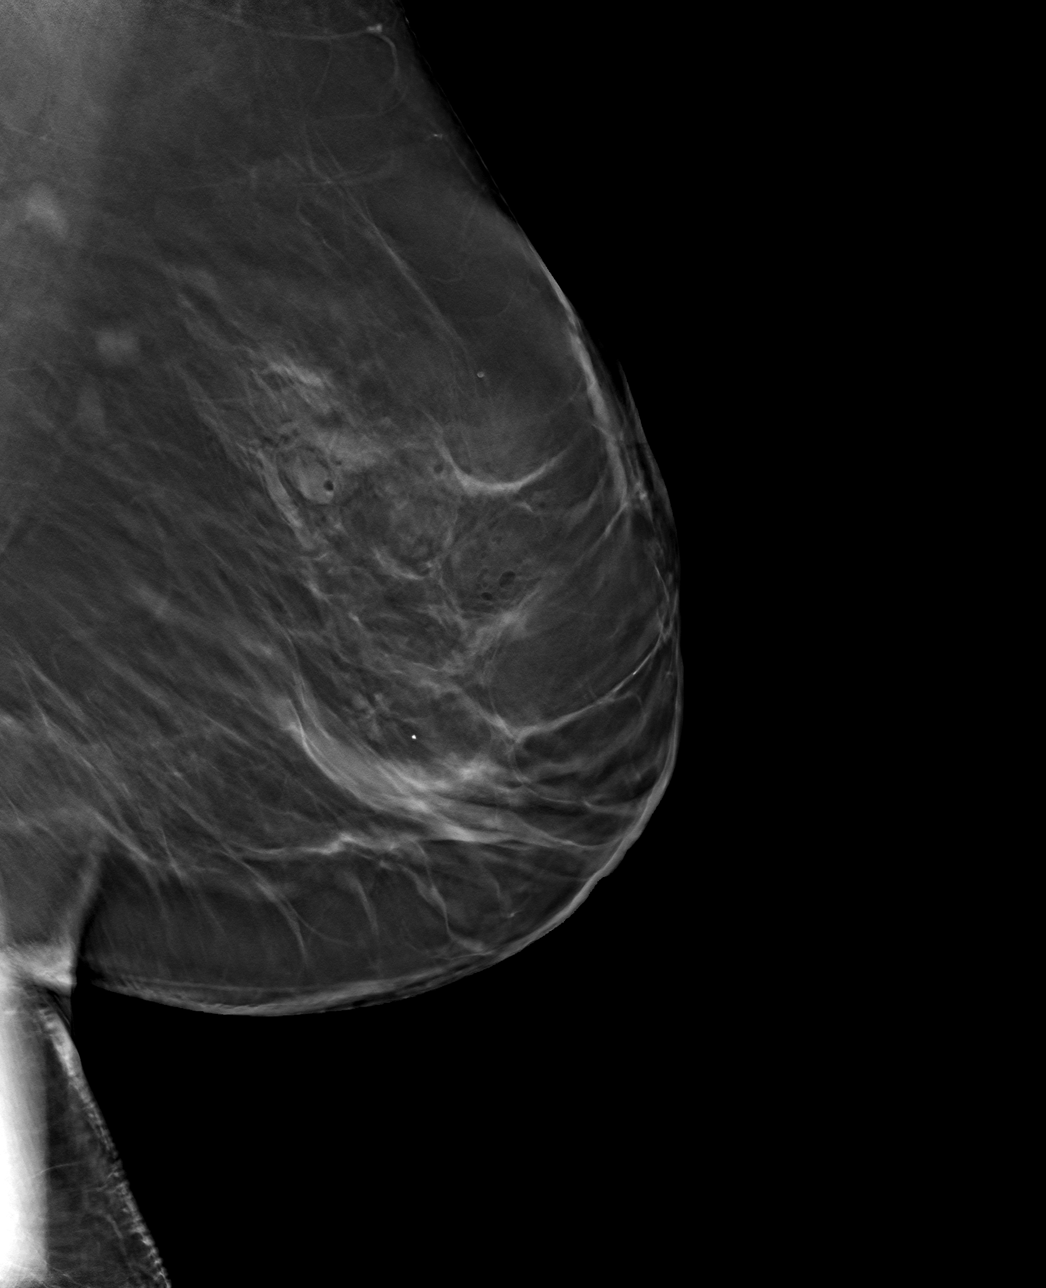

[L CC tomo · tomo slice 39/76.0]
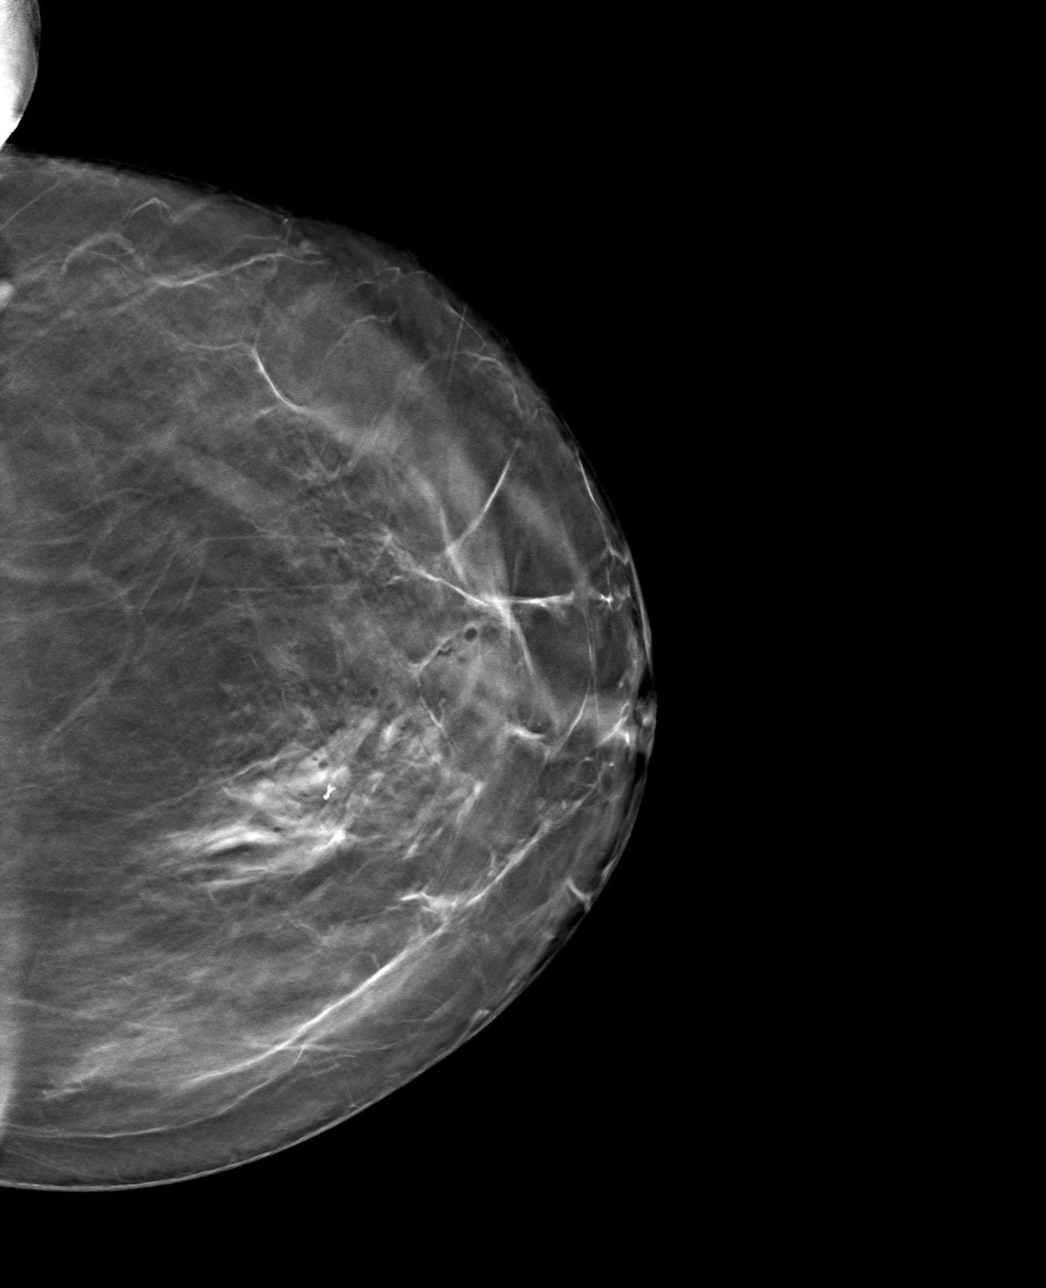

[4 of 12 positions shown; findings below may reference images not displayed]

FINDINGS: Mammographic images were obtained following ultrasound guided biopsy
of an irregular mass in the left breast. A ribbon shaped biopsy
marking clip is appropriately positioned at the site of biopsy.
IMPRESSION: Ribbon shaped biopsy marking clip appropriately positioned at the
site of biopsy in the left breast.

Final Assessment: Post Procedure Mammograms for Marker Placement

## 2020-06-10 IMAGING — US US AXILLARY LEFT
1 series · 11 of 11 positions shown · non-contrast
Comparison: No prior left axillary ultrasound available for
comparison. Correlation made with prior mammograms.

CLINICAL DATA: 62-year-old female with recent diagnosis of left
breast cancer presenting for left axillary ultrasound.

EXAM:
ULTRASOUND OF THE LEFT AXILLA

[Series 1: us axillary left · 0.07mm/px · 11 of 11 slices shown]
[im 1/11]
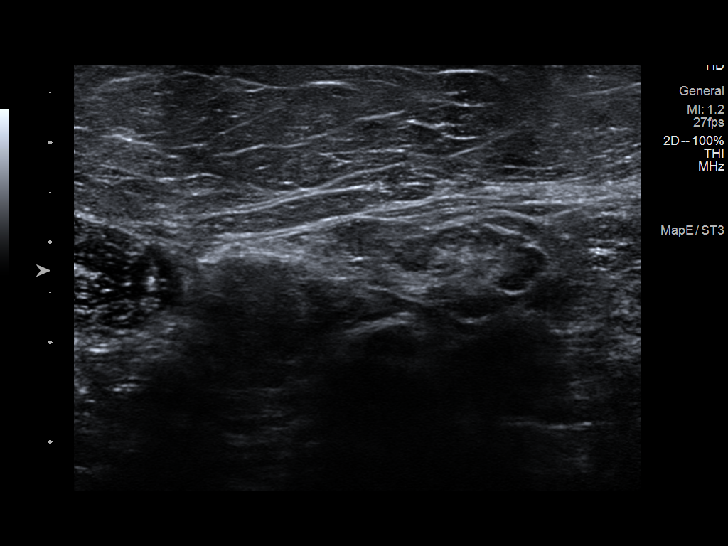
[im 2/11]
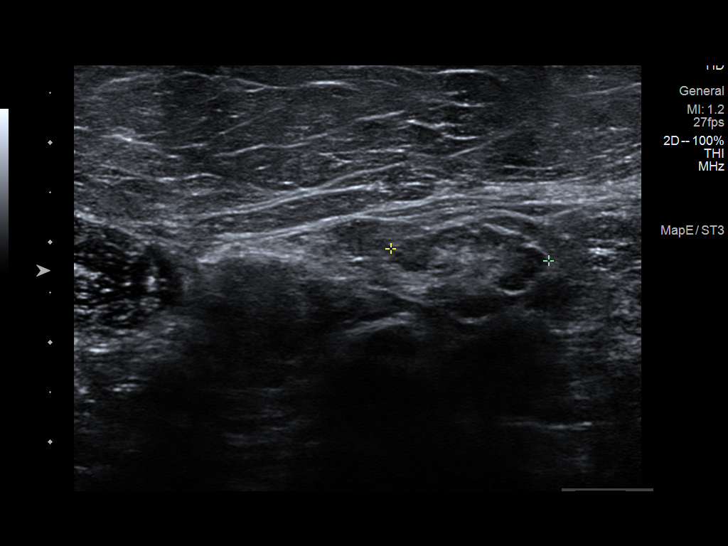
[im 3/11]
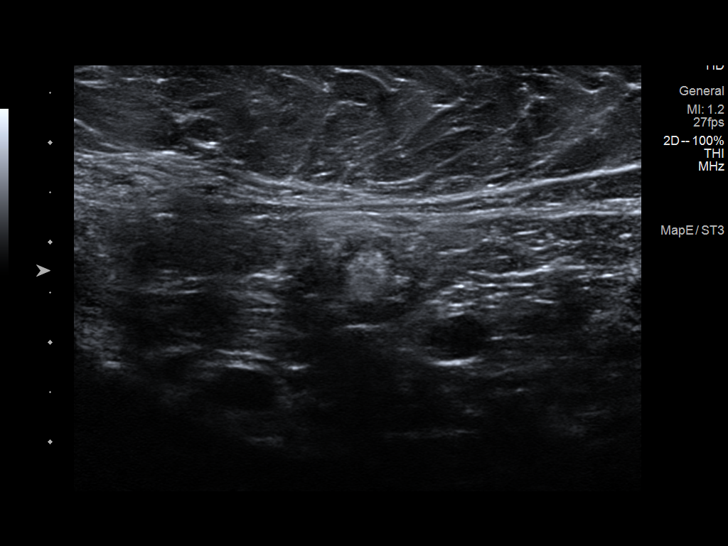
[im 4/11]
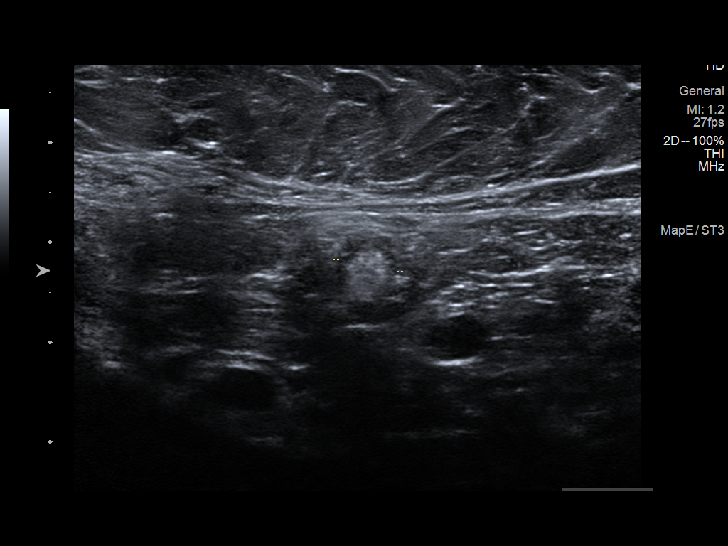
[im 5/11]
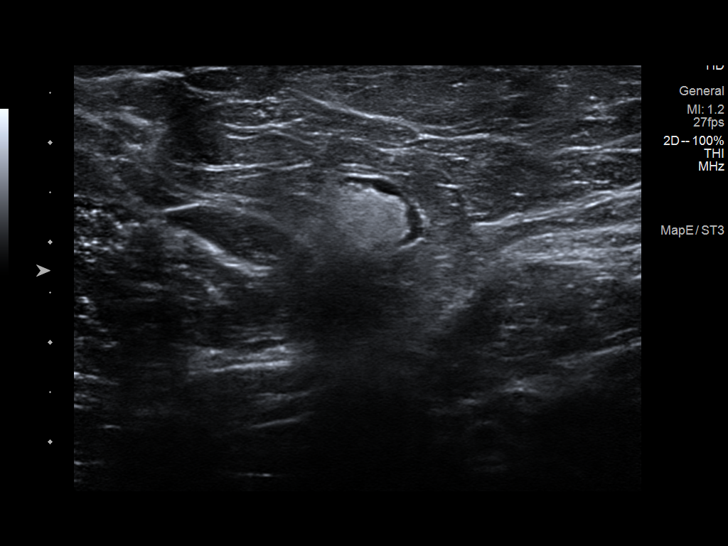
[im 6/11]
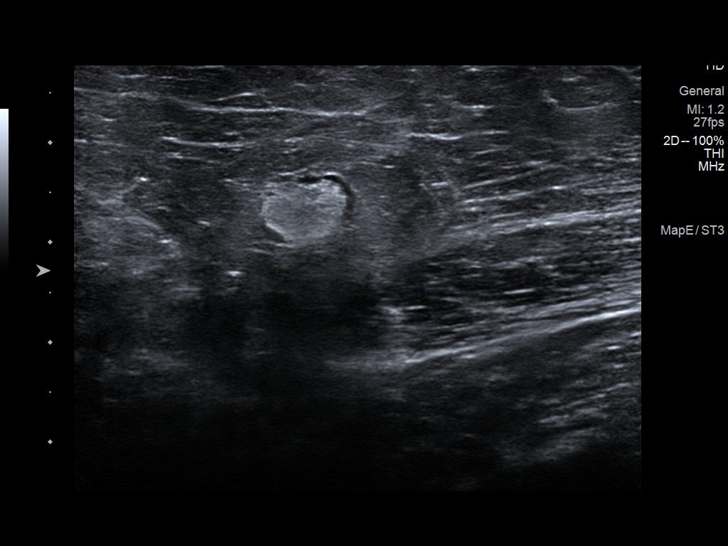
[im 7/11]
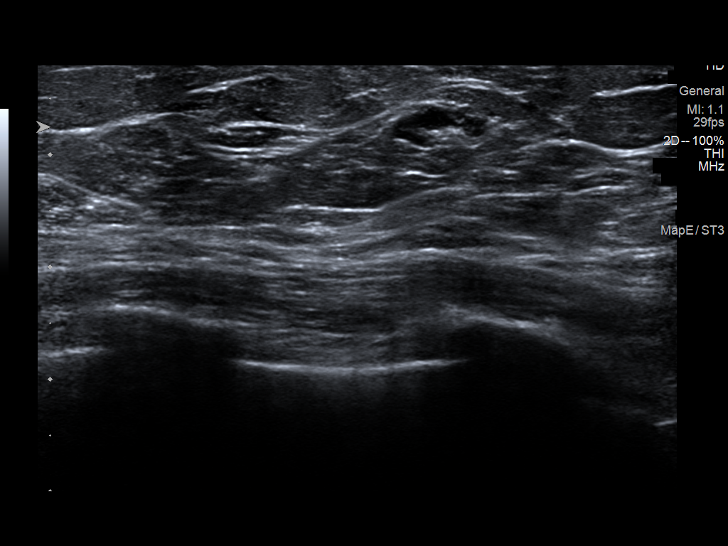
[im 8/11]
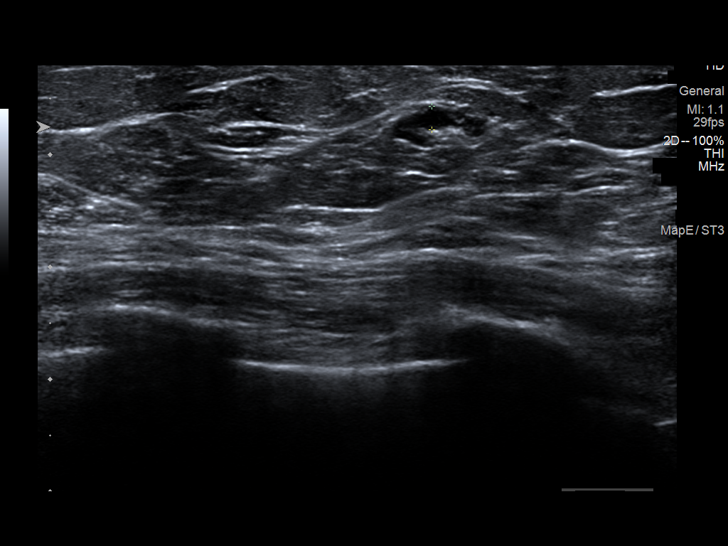
[im 9/11]
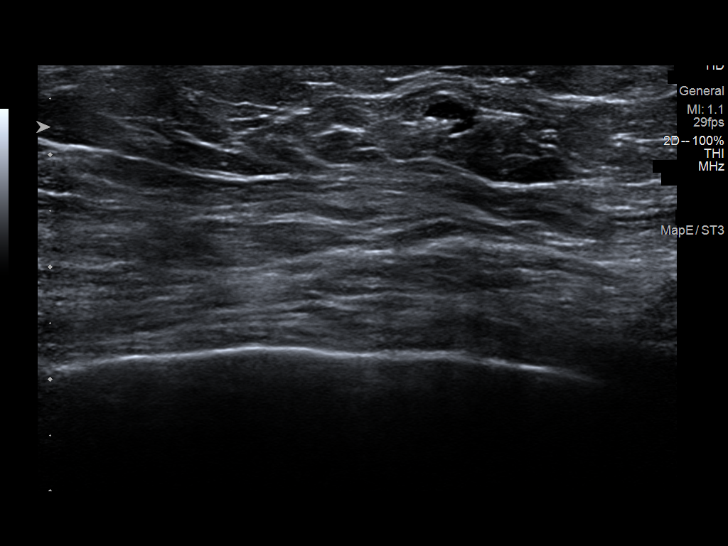
[im 10/11]
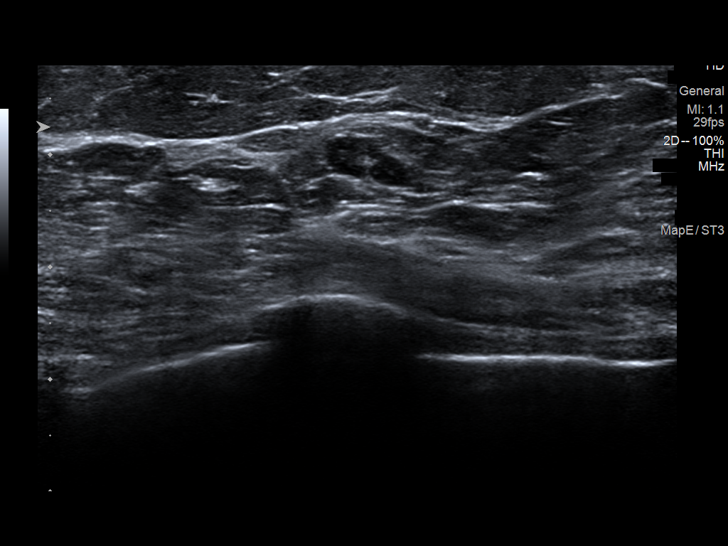
[im 11/11]
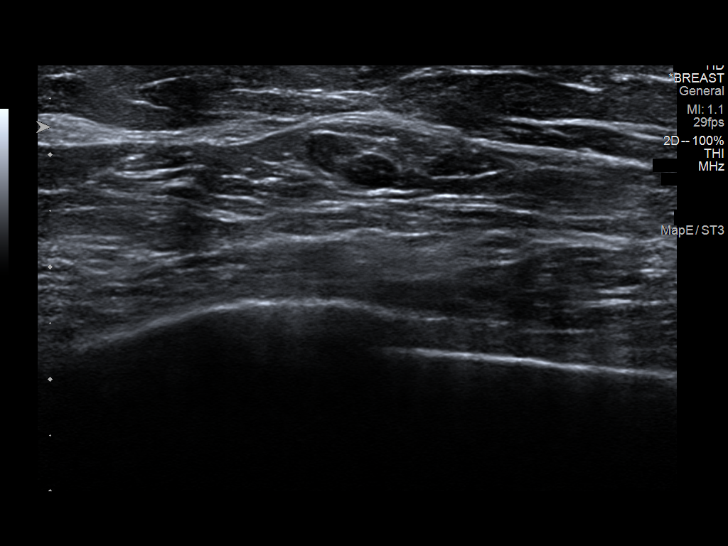

[11 of 11 positions shown; findings below may reference images not displayed]

FINDINGS: Ultrasound of the left axilla demonstrates multiple normal-appearing
lymph nodes. Two lymph nodes in the upper-outer quadrant of the left
breast are also seen, who is cortices are well within normal limits
for thickness. These are seen on prior mammograms, and have been
stable dating back to 3293.
IMPRESSION: No evidence of left axillary lymphadenopathy.

RECOMMENDATION:
Treatment plan for known left breast cancer.

I have discussed the findings and recommendations with the patient.
If applicable, a reminder letter will be sent to the patient
regarding the next appointment.

BI-RADS CATEGORY  1: Negative.

## 2020-06-20 IMAGING — MG MM BREAST SURGICAL SPECIMEN
1 series · 1 of 1 positions shown · non-contrast
Comparison: Previous exam(s).

CLINICAL DATA: Specimen radiograph status post left breast
lumpectomy.

EXAM:
SPECIMEN RADIOGRAPH OF THE LEFT BREAST

[L]
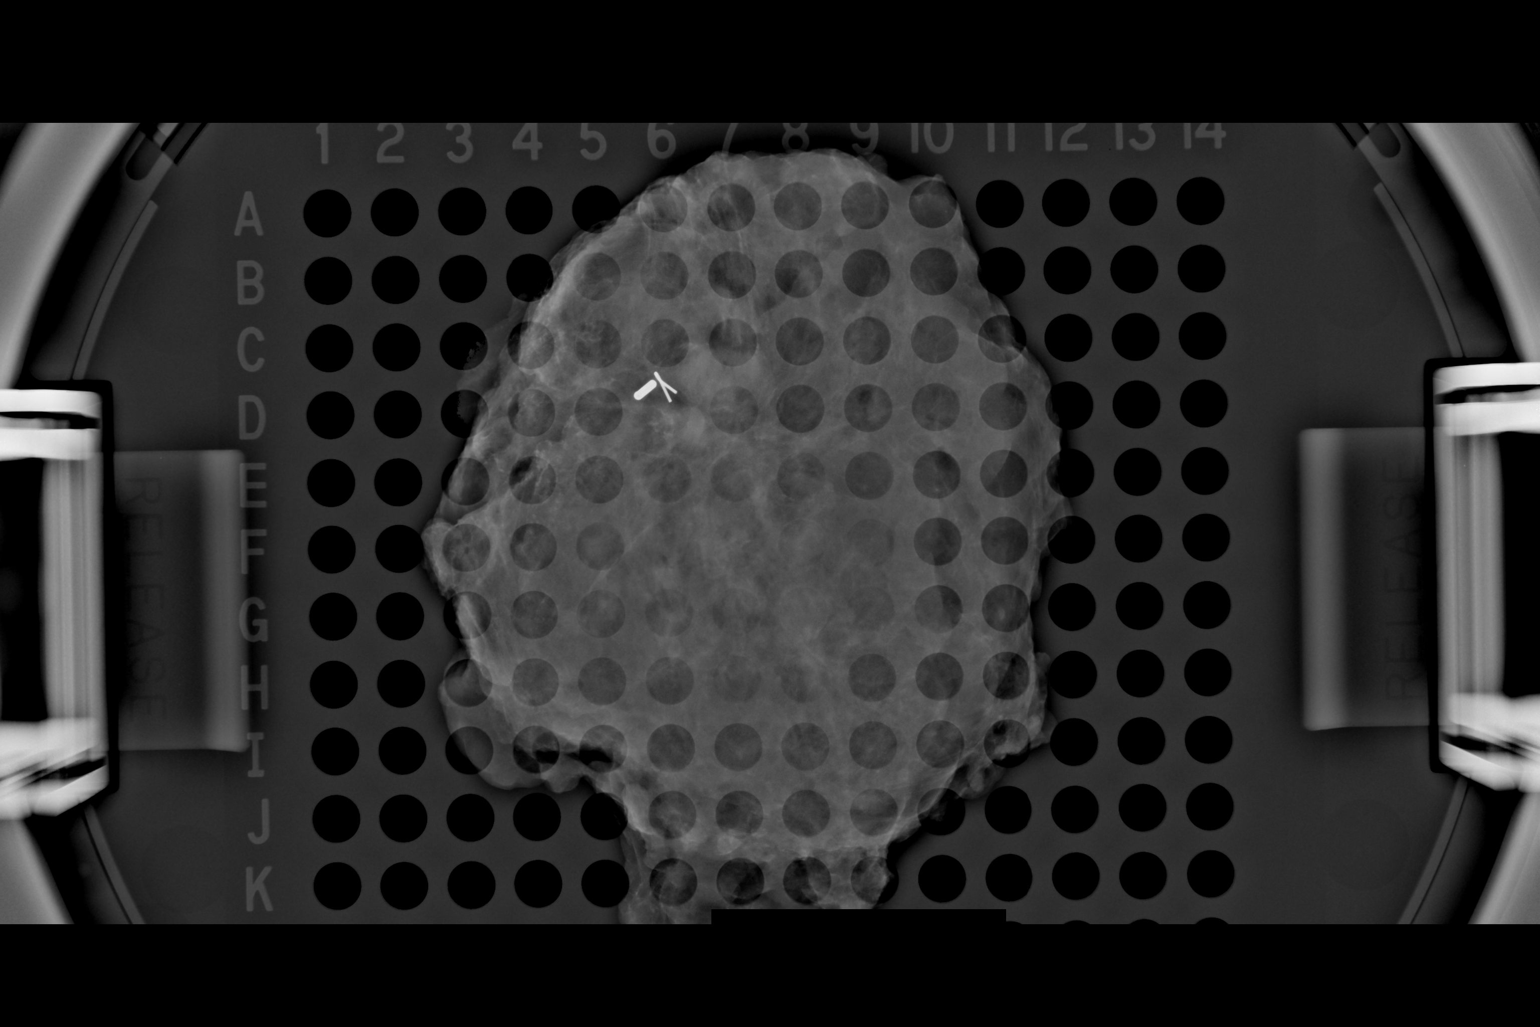

[1 of 1 positions shown; findings below may reference images not displayed]

FINDINGS: Status post excision of the left breast. The radioactive seed and
biopsy marker clip are present, completely intact, and were marked
for pathology. These findings were communicated with the OR at [DATE]
p.m.
IMPRESSION: Specimen radiograph of the left breast.

## 2020-06-20 IMAGING — DX DG CHEST 1V PORT
1 series · 1 of 1 positions shown · non-contrast
Comparison: None.

CLINICAL DATA: Post-op Port-A-CathHx HTN, heart murmur

EXAM:
PORTABLE CHEST 1 VIEW

[chest]
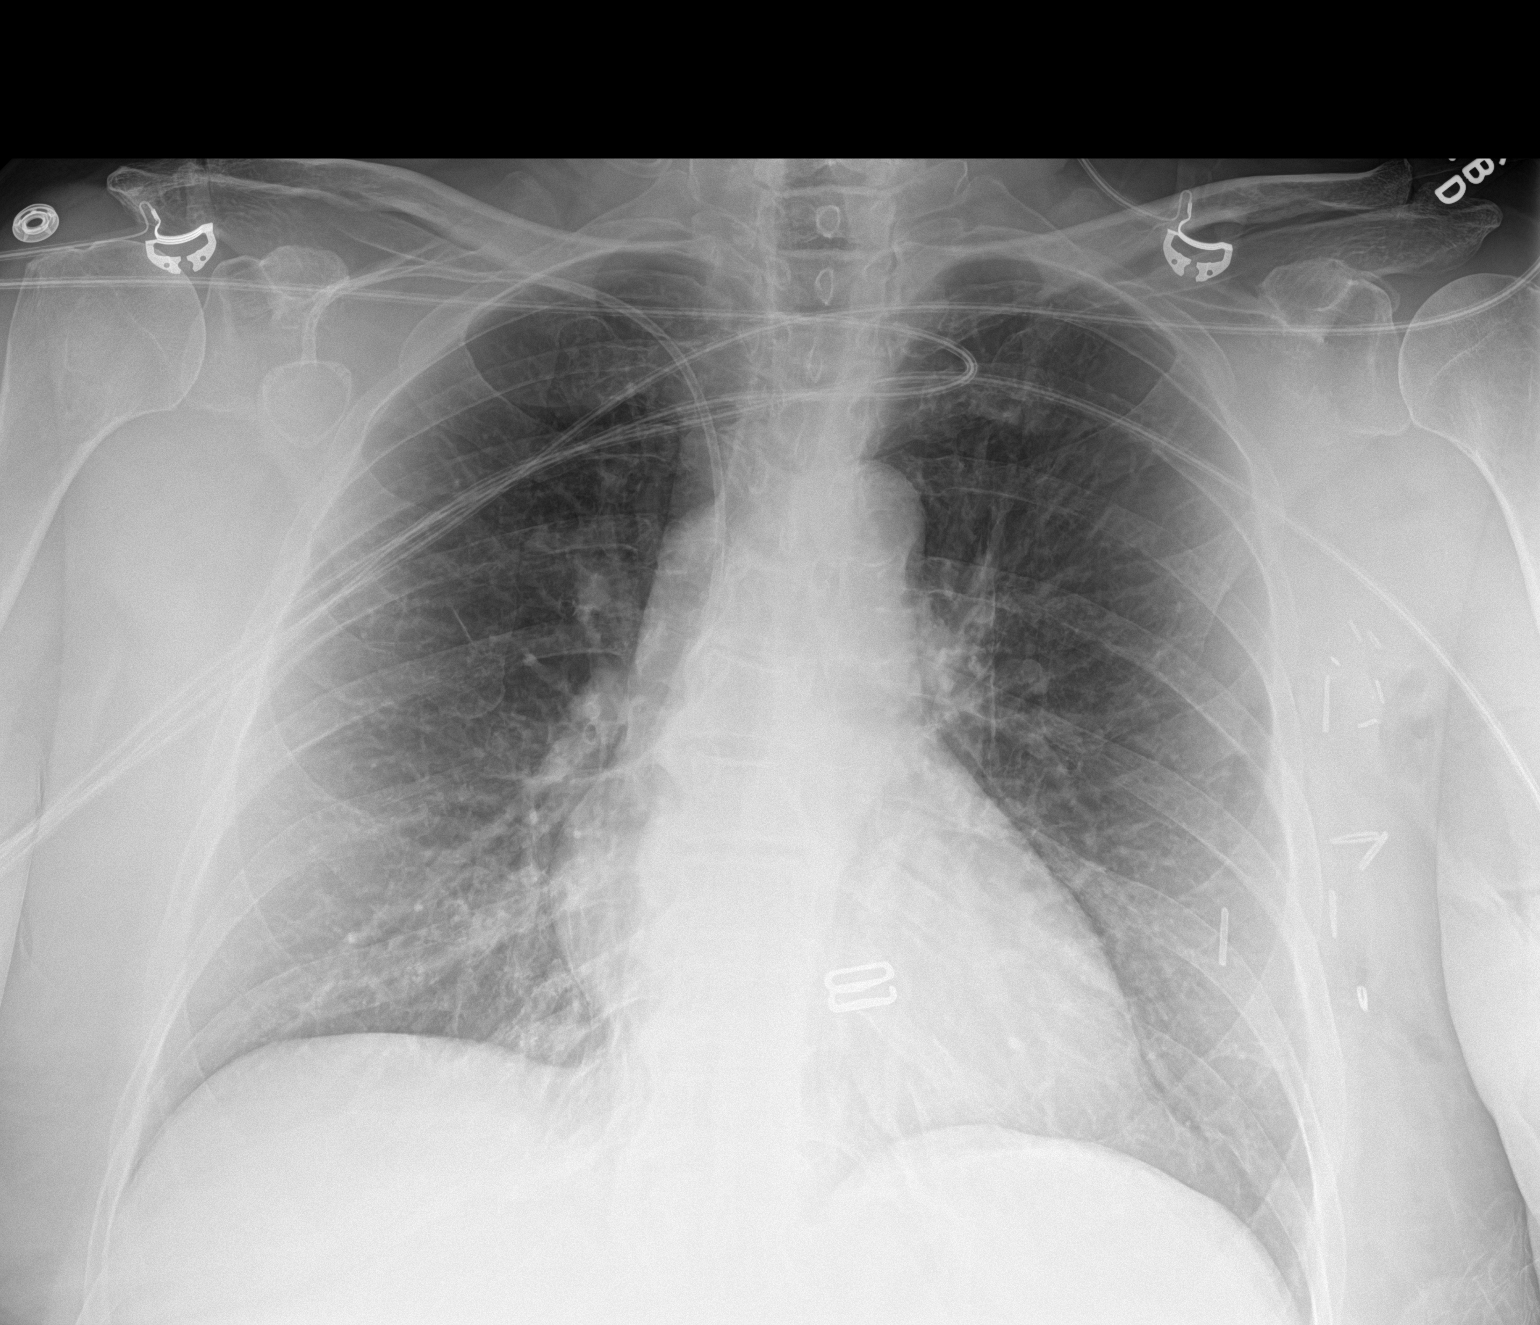

[1 of 1 positions shown; findings below may reference images not displayed]

FINDINGS: Right anterior chest wall Port-A-Cath has its tip in the mid to
lower superior vena cava.

No pneumothorax.

Surgical vascular clips are noted on the left reflecting the breast
surgery performed today.

Cardiac silhouette is normal in size. No mediastinal or hilar
masses.

Lungs are clear.  No pleural effusion.

Skeletal structures are grossly intact.
IMPRESSION: 1. Right anterior chest wall Port-A-Cath extends through the
subclavian vein, tip in the mid to lower superior vena cava.
2. No pneumothorax.
3. No acute cardiopulmonary disease.

## 2020-09-04 ENCOUNTER — Other Ambulatory Visit: Payer: Self-pay | Admitting: Internal Medicine

## 2021-02-05 ENCOUNTER — Other Ambulatory Visit: Payer: Self-pay | Admitting: Internal Medicine

## 2021-11-28 ENCOUNTER — Ambulatory Visit: Payer: Medicare Other | Admitting: Family

## 2021-12-03 ENCOUNTER — Ambulatory Visit: Payer: Self-pay

## 2021-12-03 ENCOUNTER — Encounter: Payer: Self-pay | Admitting: Family

## 2021-12-03 ENCOUNTER — Ambulatory Visit (INDEPENDENT_AMBULATORY_CARE_PROVIDER_SITE_OTHER): Payer: Medicare Other | Admitting: Family

## 2021-12-03 DIAGNOSIS — M67921 Unspecified disorder of synovium and tendon, right upper arm: Secondary | ICD-10-CM

## 2021-12-03 DIAGNOSIS — G8929 Other chronic pain: Secondary | ICD-10-CM | POA: Diagnosis not present

## 2021-12-03 DIAGNOSIS — M25511 Pain in right shoulder: Secondary | ICD-10-CM

## 2021-12-03 NOTE — Progress Notes (Signed)
? ?Office Visit Note ?  ?Patient: Laura Fisher, general practice           ?Date of Birth: 03-06-1956           ?MRN: [id number] ?Visit Date: 12/03/2021 ?             ?Requested by: Kennieth Rad, MD ?[address] ?[place] [address] ?PCP: Kennieth Rad, MD ? ?Chief Complaint  ?Patient presents with  ? Right Shoulder - Pain  ? ? ? ? ?HPI: ?Patient is a 66 year old woman who presents today for initial evaluation of right shoulder pain she fell tripping over a step about 3 weeks ago she landed on outstretched hands she has had pain underneath her right arm in front of her shoulder since she feels less strong difficulty holding a cup of coffee in her right hand loss of range of motion pain with doing her hair she states that overall she feels she might be gradually worsening however the pain is mild to moderate ? ?Assessment & Plan: ?Visit Diagnoses:  ?1. Chronic right shoulder pain   ?2. Biceps tendinopathy, right   ? ? ?Plan: Rotator cuff tendinopathy on the right.  Biceps tendinopathy.  Discussed home exercise program scapular stabilization offered Depo-Medrol injection at this point patient declined and wishes to proceed with conservative measures she will use Aleve or ibuprofen twice daily for the next 2 weeks discussed typical course.  If she fails to improve consider MRI ? ?Follow-Up Instructions: No follow-ups on file.  ? ?Right Shoulder Exam  ? ?Tenderness  ?The patient is experiencing tenderness in the biceps tendon. ? ?Range of Motion  ?The patient has normal right shoulder ROM. ? ?Muscle Strength  ?The patient has normal right shoulder strength. ? ?Tests  ?Impingement: negative ?Drop arm: negative ? ?Other  ?Sensation: normal ? ? ? ? ?Patient is alert, oriented, no adenopathy, well-dressed, normal affect, normal respiratory effort. ? ? ?Imaging: ?No results found. ?No images are attached to the encounter. ? ?Labs: ?No results found for: HGBA1C, ESRSEDRATE, CRP, LABURIC, REPTSTATUS, GRAMSTAIN, CULT,  LABORGA ? ? ?Lab Results  ?Component Value Date  ? ALBUMIN 4.8 04/19/2019  ? ? ?No results found for: MG ?Lab Results  ?Component Value Date  ? VD25OH 35 08/10/2018  ? ? ?No results found for: PREALBUMIN ? ?  Latest Ref Rng & Units 04/19/2019  ?  1:14 PM  ?CBC EXTENDED  ?WBC 4.0 - 10.5 K/uL 8.4    ?RBC 3.87 - 5.11 MIL/uL 4.69    ?Hemoglobin 12.0 - 15.0 g/dL 14.8    ?HCT 36.0 - 46.0 % 44.2    ?Platelets 150 - 400 K/uL 216    ?NEUT# 1.7 - 7.7 K/uL 5.1    ?Lymph# 0.7 - 4.0 K/uL 2.7    ? ? ? ?There is no height or weight on file to calculate BMI. ? ?Orders:  ?Orders Placed This Encounter  ?Procedures  ? XR Shoulder Right  ? ?No orders of the defined types were placed in this encounter. ? ? ? Procedures: ?No procedures performed ? ?Clinical Data: ?No additional findings. ? ?ROS: ? ?All other systems negative, except as noted in the HPI. ?Review of Systems ? ?Objective: ?Vital Signs: There were no vitals taken for this visit. ? ?Specialty Comments:  ?No specialty comments available. ? ?PMFS History: ?Patient Active Problem List  ? Diagnosis Date Noted  ? Genetic testing 05/12/2019  ? Family history of breast cancer   ? Family history of pancreatic  cancer   ? Family history of stomach cancer   ? Family history of leukemia   ? Malignant neoplasm of upper-inner quadrant of left breast in female, estrogen receptor negative ([place]) 04/18/2019  ? Moderate obstructive sleep apnea 03/02/2016  ? Drusen 10/14/2015  ? ?Past Medical History:  ?Diagnosis Date  ? Allergy   ? seasonal  ? Anal fissure   ? Arthritis   ? right knee  ? Cancer [facility])   ? breast left side dx 04/14/19  ? Colon polyp   ? Complication of anesthesia   ? Family history of pseudocholinesterase deficiency  ? Family history of adverse reaction to anesthesia   ? pt's mother had pseudocholinesterase deficiency  ? Family history of breast cancer   ? Family history of leukemia   ? Family history of pancreatic cancer   ? Family history of pseudocholinesterase deficiency   ?  mother  ? Family history of stomach cancer   ? Fatty liver   ? GERD (gastroesophageal reflux disease)   ? Heart murmur   ? benign per pt  ? High blood pressure   ? History of IBS   ? Obesity   ? Osteopenia   ? PONV (postoperative nausea and vomiting)   ? "terrible nausea" with a D&C for a miscarriage and after T&A  ? Pre-diabetes   ? Sleep apnea   ? use a oral device  ?  ?Family History  ?Problem Relation Age of Onset  ? Tongue cancer Mother 39  ? Heart disease Mother   ? Heart disease Father   ? Diabetes Sister   ? Pancreatic cancer Other   ?     Nephew  ? Stomach cancer Maternal Aunt   ? Leukemia Paternal Aunt   ? Breast cancer Cousin 71  ? Cancer Cousin   ?     unk type  ? Colon cancer Neg Hx   ? Colon polyps Neg Hx   ? Esophageal cancer Neg Hx   ? Rectal cancer Neg Hx   ?  ?Past Surgical History:  ?Procedure Laterality Date  ? BREAST LUMPECTOMY WITH RADIOACTIVE SEED AND SENTINEL LYMPH NODE BIOPSY Left 04/27/2019  ? Procedure: LEFT BREAST LUMPECTOMY WITH RADIOACTIVE SEED AND SENTINEL LYMPH NODE BIOPSY;  Surgeon: Stark Klein, MD;  Location: [place];  Service: General;  Laterality: Left;  ? COLONOSCOPY  2015  ? POLYPECTOMY    ? PORTACATH PLACEMENT Right 04/27/2019  ? Procedure: INSERTION PORT-A-CATH WITH ULTRASOUND;  Surgeon: Stark Klein, MD;  Location: [place];  Service: General;  Laterality: Right;  ? RE-EXCISION OF BREAST LUMPECTOMY Left 05/09/2019  ? Procedure: RE-EXCISION OF LEFT BREAST LUMPECTOMY;  Surgeon: Stark Klein, MD;  Location: [place];  Service: General;  Laterality: Left;  ? TONSILECTOMY, ADENOIDECTOMY, BILATERAL MYRINGOTOMY AND TUBES    ? never had tubes in ears  ? WISDOM TOOTH EXTRACTION    ? WRIST SURGERY Bilateral   ? ?Social History  ? ?Occupational History  ? Occupation: Art therapist  ?Tobacco Use  ? Smoking status: Never  ? Smokeless tobacco: Never  ?Vaping Use  ? Vaping Use: Never used  ?Substance and Sexual Activity  ? Alcohol use: Never  ? Drug use: Never  ? Sexual  activity: Yes  ?  Partners: Male  ? ? ? ? ? ?

## 2022-02-16 ENCOUNTER — Other Ambulatory Visit: Payer: Self-pay

## 2022-05-21 ENCOUNTER — Other Ambulatory Visit: Payer: Self-pay

## 2022-05-26 ENCOUNTER — Ambulatory Visit (INDEPENDENT_AMBULATORY_CARE_PROVIDER_SITE_OTHER): Payer: Medicare Other

## 2022-05-26 ENCOUNTER — Ambulatory Visit (INDEPENDENT_AMBULATORY_CARE_PROVIDER_SITE_OTHER): Payer: Medicare Other | Admitting: Family

## 2022-05-26 ENCOUNTER — Encounter: Payer: Self-pay | Admitting: Family

## 2022-05-26 DIAGNOSIS — M25512 Pain in left shoulder: Secondary | ICD-10-CM

## 2022-05-26 NOTE — Progress Notes (Unsigned)
Office Visit Note   Patient: Laura Fisher           Date of Birth: [birth year]           MRN: [id number] Visit Date: 05/26/2022              Requested by: Kennieth Rad, MD [facility] [address] [place] [address] PCP: Kennieth Rad, MD  Chief Complaint  Patient presents with   Left Shoulder - Pain      HPI: The patient is a 66 year old woman seen today for evaluation of left shoulder pain  This has been ongoing for a few weeks at night has gotten gradually worse.  She states that she is not having much difficulty with range of motion or her activities of daily living however when she lies down to sleep she cannot lie on her left side or put pressure on the left shoulder pain with pressure to the posterior lateral shoulder not sure that she has had any associated injury she did fall 6 to 8 weeks ago but did not land on her shoulder  Has a history of triple negative breast cancer and is quite concerned for possible metastases.  Deep aching poorly localized pain  Assessment & Plan: Visit Diagnoses:  1. Acute pain of left shoulder     Plan: Possible muscle strain.  Unlikely to be impingement.  Radiographs unequivocal today.  Will discuss with her oncology team.  Unable to use anti-inflammatories on.  Tylenol hot compresses rest.  Activities as tolerated.  Follow-Up Instructions: No follow-ups on file.   Left Shoulder Exam   Tenderness  Left shoulder tenderness location: scapular border.  Range of Motion  The patient has normal left shoulder ROM.  Muscle Strength  The patient has normal left shoulder strength.  Tests  Impingement: negative Drop arm: negative  Other  Erythema: absent Pulse: present       Patient is alert, oriented, no adenopathy, well-dressed, normal affect, normal respiratory effort.   Imaging: No results found. No images are attached to the encounter.  Labs: No results found for: "HGBA1C",  "ESRSEDRATE", "CRP", "LABURIC", "REPTSTATUS", "GRAMSTAIN", "CULT", "LABORGA"   Lab Results  Component Value Date   ALBUMIN 4.8 04/19/2019    No results found for: "MG" Lab Results  Component Value Date   VD25OH 35 08/10/2018    No results found for: "PREALBUMIN"    Latest Ref Rng & Units 04/19/2019    1:14 PM  CBC EXTENDED  WBC 4.0 - 10.5 K/uL 8.4   RBC 3.87 - 5.11 MIL/uL 4.69   Hemoglobin 12.0 - 15.0 g/dL 14.8   HCT 36.0 - 46.0 % 44.2   Platelets 150 - 400 K/uL 216   NEUT# 1.7 - 7.7 K/uL 5.1   Lymph# 0.7 - 4.0 K/uL 2.7      There is no height or weight on file to calculate BMI.  Orders:  Orders Placed This Encounter  Procedures   XR Shoulder Left   No orders of the defined types were placed in this encounter.    Procedures: No procedures performed  Clinical Data: No additional findings.  ROS:  All other systems negative, except as noted in the HPI. Review of Systems  Constitutional:  Negative for chills and fever.  Musculoskeletal:  Positive for arthralgias and myalgias. Negative for back pain, joint swelling and neck pain.  Neurological:  Negative for weakness and numbness.    Objective: Vital Signs: There were no vitals taken  for this visit.  Specialty Comments:  No specialty comments available.  PMFS History: Patient Active Problem List   Diagnosis Date Noted   Genetic testing 05/12/2019   Family history of breast cancer    Family history of pancreatic cancer    Family history of stomach cancer    Family history of leukemia    Malignant neoplasm of upper-inner quadrant of left breast in female, estrogen receptor negative ([place]) 04/18/2019   Moderate obstructive sleep apnea 03/02/2016   Drusen 10/14/2015   Past Medical History:  Diagnosis Date   Allergy    seasonal   Anal fissure    Arthritis    right knee   Cancer ([place])    breast left side dx 04/14/19   Colon polyp    Complication of anesthesia    Family history of  pseudocholinesterase deficiency   Family history of adverse reaction to anesthesia    pt's mother had pseudocholinesterase deficiency   Family history of breast cancer    Family history of leukemia    Family history of pancreatic cancer    Family history of pseudocholinesterase deficiency    mother   Family history of stomach cancer    Fatty liver    GERD (gastroesophageal reflux disease)    Heart murmur    benign per pt   High blood pressure    History of IBS    Obesity    Osteopenia    PONV (postoperative nausea and vomiting)    "terrible nausea" with a D&C for a miscarriage and after T&A   Pre-diabetes    Sleep apnea    use a oral device    Family History  Problem Relation Age of Onset   Tongue cancer Mother 61   Heart disease Mother    Heart disease Father    Diabetes Sister    Pancreatic cancer Other        Nephew   Stomach cancer Maternal Aunt    Leukemia Paternal Aunt    Breast cancer Cousin 12   Cancer Cousin        unk type   Colon cancer Neg Hx    Colon polyps Neg Hx    Esophageal cancer Neg Hx    Rectal cancer Neg Hx     Past Surgical History:  Procedure Laterality Date   BREAST LUMPECTOMY WITH RADIOACTIVE SEED AND SENTINEL LYMPH NODE BIOPSY Left 04/27/2019   Procedure: LEFT BREAST LUMPECTOMY WITH RADIOACTIVE SEED AND SENTINEL LYMPH NODE BIOPSY;  Surgeon: Stark Klein, MD;  Location: [place];  Service: General;  Laterality: Left;   COLONOSCOPY  2015   POLYPECTOMY     PORTACATH PLACEMENT Right 04/27/2019   Procedure: INSERTION PORT-A-CATH WITH ULTRASOUND;  Surgeon: Stark Klein, MD;  Location: [place];  Service: General;  Laterality: Right;   RE-EXCISION OF BREAST LUMPECTOMY Left 05/09/2019   Procedure: RE-EXCISION OF LEFT BREAST LUMPECTOMY;  Surgeon: Stark Klein, MD;  Location: [place];  Service: General;  Laterality: Left;   TONSILECTOMY, ADENOIDECTOMY, BILATERAL MYRINGOTOMY AND TUBES     never had tubes in ears   WISDOM TOOTH  EXTRACTION     WRIST SURGERY Bilateral    Social History   Occupational History   Occupation: Art therapist  Tobacco Use   Smoking status: Never   Smokeless tobacco: Never  Vaping Use   Vaping Use: Never used  Substance and Sexual Activity   Alcohol use: Never   Drug use: Never   Sexual activity: Yes  Partners: Male

## 2022-05-31 ENCOUNTER — Other Ambulatory Visit: Payer: Self-pay

## 2024-06-13 ENCOUNTER — Other Ambulatory Visit: Payer: Self-pay

## 2024-07-24 ENCOUNTER — Ambulatory Visit: Admitting: Orthopedic Surgery
